# Patient Record
Sex: Male | Born: 1995 | Race: White | Hispanic: No | Marital: Single | Smoking: Former smoker
Health system: Southern US, Community
[De-identification: ages and names within clinical notes are randomized; demographics above are authoritative.]

## PROBLEM LIST (undated history)

## (undated) DIAGNOSIS — M5481 Occipital neuralgia: Secondary | ICD-10-CM

## (undated) DIAGNOSIS — H02401 Unspecified ptosis of right eyelid: Secondary | ICD-10-CM

## (undated) DIAGNOSIS — N289 Disorder of kidney and ureter, unspecified: Secondary | ICD-10-CM

## (undated) DIAGNOSIS — R059 Cough, unspecified: Secondary | ICD-10-CM

## (undated) DIAGNOSIS — R079 Chest pain, unspecified: Secondary | ICD-10-CM

## (undated) DIAGNOSIS — Z87442 Personal history of urinary calculi: Secondary | ICD-10-CM

## (undated) DIAGNOSIS — R05 Cough: Secondary | ICD-10-CM

## (undated) DIAGNOSIS — R809 Proteinuria, unspecified: Secondary | ICD-10-CM

## (undated) DIAGNOSIS — J309 Allergic rhinitis, unspecified: Secondary | ICD-10-CM

## (undated) DIAGNOSIS — S93409A Sprain of unspecified ligament of unspecified ankle, initial encounter: Secondary | ICD-10-CM

## (undated) DIAGNOSIS — R040 Epistaxis: Secondary | ICD-10-CM

## (undated) DIAGNOSIS — J039 Acute tonsillitis, unspecified: Secondary | ICD-10-CM

## (undated) HISTORY — PX: RENAL BIOPSY: SHX156

## (undated) HISTORY — DX: Cough, unspecified: R05.9

## (undated) HISTORY — DX: Cough: R05

## (undated) HISTORY — DX: Proteinuria, unspecified: R80.9

## (undated) HISTORY — DX: Chest pain, unspecified: R07.9

## (undated) HISTORY — DX: Occipital neuralgia: M54.81

## (undated) HISTORY — DX: Sprain of unspecified ligament of unspecified ankle, initial encounter: S93.409A

## (undated) HISTORY — DX: Allergic rhinitis, unspecified: J30.9

## (undated) HISTORY — DX: Disorder of kidney and ureter, unspecified: N28.9

## (undated) HISTORY — DX: Acute tonsillitis, unspecified: J03.90

## (undated) HISTORY — DX: Unspecified ptosis of right eyelid: H02.401

---
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
## 2014-03-30 ENCOUNTER — Emergency Department: Payer: Self-pay | Admitting: Emergency Medicine

## 2014-06-19 ENCOUNTER — Emergency Department: Payer: Self-pay | Admitting: Emergency Medicine

## 2014-08-06 ENCOUNTER — Emergency Department: Payer: Self-pay | Admitting: Emergency Medicine

## 2014-08-08 ENCOUNTER — Emergency Department: Payer: Self-pay | Admitting: Emergency Medicine

## 2014-08-10 LAB — WOUND CULTURE

## 2014-11-05 ENCOUNTER — Emergency Department: Payer: Self-pay | Admitting: Emergency Medicine

## 2014-12-16 ENCOUNTER — Emergency Department
Admission: EM | Admit: 2014-12-16 | Discharge: 2014-12-16 | Disposition: A | Discharge status: Home / Self Care | Payer: Medicaid Other | Attending: Emergency Medicine | Admitting: Emergency Medicine

## 2014-12-16 DIAGNOSIS — J069 Acute upper respiratory infection, unspecified: Secondary | ICD-10-CM

## 2014-12-16 DIAGNOSIS — R05 Cough: Secondary | ICD-10-CM | POA: Diagnosis present

## 2014-12-16 DIAGNOSIS — Z720 Tobacco use: Secondary | ICD-10-CM | POA: Insufficient documentation

## 2014-12-16 MED ORDER — PSEUDOEPH-BROMPHEN-DM 30-2-10 MG/5ML PO SYRP: 5.0000 mL | ORAL_SOLUTION | Freq: Four times a day (QID) | ORAL | Status: DC | PRN

## 2014-12-16 NOTE — Discharge Instructions (Signed)
Rest. Drink plenty of fluids. Follow up with your primary care physician or the above as needed.   Return to ER for new or worsening concerns.   Upper Respiratory Infection, Adult An upper respiratory infection (URI) is also sometimes known as the common cold. The upper respiratory tract includes the nose, sinuses, throat, trachea, and bronchi. Bronchi are the airways leading to the lungs. Most people improve within 1 week, but symptoms can last up to 2 weeks. A residual cough may last even longer.  CAUSES Many different viruses can infect the tissues lining the upper respiratory tract. The tissues become irritated and inflamed and often become very moist. Mucus production is also common. A cold is contagious. You can easily spread the virus to others by oral contact. This includes kissing, sharing a glass, coughing, or sneezing. Touching your mouth or nose and then touching a surface, which is then touched by another person, can also spread the virus. SYMPTOMS  Symptoms typically develop 1 to 3 days after you come in contact with a cold virus. Symptoms vary from person to person. They may include:  Runny nose.  Sneezing.  Nasal congestion.  Sinus irritation.  Sore throat.  Loss of voice (laryngitis).  Cough.  Fatigue.  Muscle aches.  Loss of appetite.  Headache.  Low-grade fever. DIAGNOSIS  You might diagnose your own cold based on familiar symptoms, since most people get a cold 2 to 3 times a year. Your caregiver can confirm this based on your exam. Most importantly, your caregiver can check that your symptoms are not due to another disease such as strep throat, sinusitis, pneumonia, asthma, or epiglottitis. Blood tests, throat tests, and X-rays are not necessary to diagnose a common cold, but they may sometimes be helpful in excluding other more serious diseases. Your caregiver will decide if any further tests are required. RISKS AND COMPLICATIONS  You may be at risk for a  more severe case of the common cold if you smoke cigarettes, have chronic heart disease (such as heart failure) or lung disease (such as asthma), or if you have a weakened immune system. The very young and very old are also at risk for more serious infections. Bacterial sinusitis, middle ear infections, and bacterial pneumonia can complicate the common cold. The common cold can worsen asthma and chronic obstructive pulmonary disease (COPD). Sometimes, these complications can require emergency medical care and may be life-threatening. PREVENTION  The best way to protect against getting a cold is to practice good hygiene. Avoid oral or hand contact with people with cold symptoms. Wash your hands often if contact occurs. There is no clear evidence that vitamin C, vitamin E, echinacea, or exercise reduces the chance of developing a cold. However, it is always recommended to get plenty of rest and practice good nutrition. TREATMENT  Treatment is directed at relieving symptoms. There is no cure. Antibiotics are not effective, because the infection is caused by a virus, not by bacteria. Treatment may include:  Increased fluid intake. Sports drinks offer valuable electrolytes, sugars, and fluids.  Breathing heated mist or steam (vaporizer or shower).  Eating chicken soup or other clear broths, and maintaining good nutrition.  Getting plenty of rest.  Using gargles or lozenges for comfort.  Controlling fevers with ibuprofen or acetaminophen as directed by your caregiver.  Increasing usage of your inhaler if you have asthma. Zinc gel and zinc lozenges, taken in the first 24 hours of the common cold, can shorten the duration and lessen the  severity of symptoms. Pain medicines may help with fever, muscle aches, and throat pain. A variety of non-prescription medicines are available to treat congestion and runny nose. Your caregiver can make recommendations and may suggest nasal or lung inhalers for other  symptoms.  HOME CARE INSTRUCTIONS   Only take over-the-counter or prescription medicines for pain, discomfort, or fever as directed by your caregiver.  Use a warm mist humidifier or inhale steam from a shower to increase air moisture. This may keep secretions moist and make it easier to breathe.  Drink enough water and fluids to keep your urine clear or pale yellow.  Rest as needed.  Return to work when your temperature has returned to normal or as your caregiver advises. You may need to stay home longer to avoid infecting others. You can also use a face mask and careful hand washing to prevent spread of the virus. SEEK MEDICAL CARE IF:   After the first few days, you feel you are getting worse rather than better.  You need your caregiver's advice about medicines to control symptoms.  You develop chills, worsening shortness of breath, or brown or red sputum. These may be signs of pneumonia.  You develop yellow or brown nasal discharge or pain in the face, especially when you bend forward. These may be signs of sinusitis.  You develop a fever, swollen neck glands, pain with swallowing, or white areas in the back of your throat. These may be signs of strep throat. SEEK IMMEDIATE MEDICAL CARE IF:   You have a fever.  You develop severe or persistent headache, ear pain, sinus pain, or chest pain.  You develop wheezing, a prolonged cough, cough up blood, or have a change in your usual mucus (if you have chronic lung disease).  You develop sore muscles or a stiff neck. Document Released: 01/24/2001 Document Revised: 10/23/2011 Document Reviewed: 11/05/2013 [facility] Patient Information 2015 [place], [place]. This information is not intended to replace advice given to you by your health care provider. Make sure you discuss any questions you have with your health care provider.

## 2014-12-16 NOTE — ED Notes (Signed)
Cough and congestion

## 2014-12-16 NOTE — ED Provider Notes (Signed)
[facility] Emergency Department Provider Note ____________________________________________  Time seen: ----------------------------------------- 8:08 PM on 12/16/2014 -----------------------------------------   I have reviewed the triage vital signs and the nursing notes.   HISTORY  Chief Complaint Cough    HPI Louis Miller is a 19 y.o. male presents to the ER today with 2 day history of runny nose cough and congestion. States multiple other people in same household with similar.Reports continues to eat and drink well. States congestion is biggest complaint. Denies wheezes, chest pain, shortness of breath sore throat, abdominal pain, nausea, vomiting or diarrhea.   Past Medical History  Diagnosis Date  . Renal disorder     IgA nephropathy  . Sprain of ankle, unspecified site   . Allergic rhinitis, cause unspecified   . Chest pain, unspecified   . Tonsillitis   . Proteinuria   . Cough   . Occipital neuralgia 06/11/2013    left  . 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 Occipital neuralgia 06/11/2013  . 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 Renal biopsy    States "kidney problems" as a child. States normal as an adult.  Medications: DENIES.  Allergies Shellfish allergy  Family History  Problem Relation Age of Onset  . Migraines Mother   . Hypertension Mother   . Hypertension Father     Social History History  Substance Use Topics  . Smoking status: Current Every Day Smoker    Types: Cigarettes  . Smokeless tobacco: Never Used  . Alcohol Use: No    Review of Systems  Constitutional: Negative for fever. Eyes: Negative for visual changes. ENT: as above. Cardiovascular: Negative for chest pain. Respiratory: Negative for shortness of breath. Gastrointestinal: Negative for abdominal pain, vomiting and  diarrhea. Genitourinary: Negative for dysuria. Musculoskeletal: Negative for back pain. Skin: Negative for rash. Neurological: Negative for headaches, focal weakness or numbness.   10-point ROS otherwise negative.  ____________________________________________   PHYSICAL EXAM: Blood pressure 129/75, pulse 66,resp 18 temperature 98 F (36.7 C), temperature source Oral, SpO2 97 %.  VITAL SIGNS: ED Triage Vitals  Enc Vitals Group     BP --      Pulse --      Resp --      Temp --      Temp src --      SpO2 --      Weight --      Height --      Head Cir --      Peak Flow --      Pain Score --      Pain Loc --      Pain Edu? --      Excl. in GC? --     Constitutional: Alert and oriented. Well appearing and in no distress. Eyes: Conjunctivae are normal. PERRL. Normal extraocular movements. ENT   Head: Normocephalic and atraumatic.   Nose: mild rhinorrhea, mild nasal mucosal inflammation   Mouth/Throat: Mucous membranes are moist. No pharyngeal erythema, no swelling or exudate.   Neck: No stridor. Hematological/Lymphatic/Immunilogical: No cervical lymphadenopathy. Cardiovascular: Normal rate, regular rhythm. Normal and symmetric distal pulses are present in all extremities. No murmurs, rubs, or gallops. Respiratory: Normal respiratory effort without tachypnea nor retractions. Breath sounds are clear and equal bilaterally. No wheezes/rales/rhonchi. Gastrointestinal: Soft and nontender. No distention. No abdominal bruits. There is no CVA tenderness. Genitourinary:  deferred Musculoskeletal: Nontender with normal range of motion in all extremities. No joint effusions.  No lower extremity tenderness nor edema. Neurologic:  Normal speech and language. No gross focal neurologic deficits are appreciated. Speech is normal. No gait instability. Skin:  Skin is warm, dry and intact. No rash noted. Psychiatric: Mood and affect are normal. Speech and behavior are normal.  Patient exhibits appropriate insight and judgment.  __   INITIAL IMPRESSION / ASSESSMENT AND PLAN / ED COURSE  Pertinent labs & imaging results that were available during my care of the patient were reviewed by me and considered in my medical decision making (see chart for details).  Very well appearing. Present with 2 days of URI signs and symptoms. Lungs clear throughout with good air movement. Rest. Follow up with primary care physician.  Discussed follow up and return precautions.  ____________________________________________   FINAL CLINICAL IMPRESSION(S) / ED DIAGNOSES  Final diagnoses:  Upper respiratory infection    Renford DillsLindsey Ozie Lupe, NP 12/16/14 [id number]  Phineas SemenGraydon Goodman, MD 12/16/14 [phone]

## 2015-09-24 ENCOUNTER — Emergency Department
Admission: EM | Admit: 2015-09-24 | Discharge: 2015-09-24 | Disposition: A | Discharge status: Home / Self Care | Payer: Medicaid Other | Attending: Emergency Medicine | Admitting: Emergency Medicine

## 2015-09-24 ENCOUNTER — Encounter: Payer: Self-pay | Admitting: Emergency Medicine

## 2015-09-24 DIAGNOSIS — N028 Recurrent and persistent hematuria with other morphologic changes: Secondary | ICD-10-CM | POA: Diagnosis not present

## 2015-09-24 DIAGNOSIS — R109 Unspecified abdominal pain: Secondary | ICD-10-CM

## 2015-09-24 DIAGNOSIS — F17210 Nicotine dependence, cigarettes, uncomplicated: Secondary | ICD-10-CM | POA: Insufficient documentation

## 2015-09-24 DIAGNOSIS — K047 Periapical abscess without sinus: Secondary | ICD-10-CM | POA: Insufficient documentation

## 2015-09-24 DIAGNOSIS — K0889 Other specified disorders of teeth and supporting structures: Secondary | ICD-10-CM | POA: Diagnosis present

## 2015-09-24 DIAGNOSIS — K032 Erosion of teeth: Secondary | ICD-10-CM | POA: Insufficient documentation

## 2015-09-24 LAB — URINALYSIS COMPLETE WITH MICROSCOPIC (ARMC ONLY)
BILIRUBIN URINE: NEGATIVE
Bacteria, UA: NONE SEEN
Glucose, UA: NEGATIVE mg/dL
KETONES UR: NEGATIVE mg/dL
Leukocytes, UA: NEGATIVE
Nitrite: NEGATIVE
PROTEIN: NEGATIVE mg/dL
Specific Gravity, Urine: 1.012 (ref 1.005–1.030)
WBC, UA: NONE SEEN WBC/hpf (ref 0–5)
pH: 7 (ref 5.0–8.0)

## 2015-09-24 MED ORDER — NAPROXEN 500 MG PO TABS: 500.0000 mg | ORAL_TABLET | Freq: Two times a day (BID) | ORAL | Status: DC

## 2015-09-24 MED ORDER — AMOXICILLIN 875 MG PO TABS: 875.0000 mg | ORAL_TABLET | Freq: Two times a day (BID) | ORAL | Status: DC

## 2015-09-24 MED ORDER — LIDOCAINE-EPINEPHRINE 2 %-1:100000 IJ SOLN
INTRAMUSCULAR | Status: AC
  Administered 2015-09-24: 1.7 mL
  Filled 2015-09-24: qty 1.7

## 2015-09-24 MED ORDER — LIDOCAINE-EPINEPHRINE 2 %-1:100000 IJ SOLN: 1.7000 mL | Freq: Once | INTRAMUSCULAR | Status: AC

## 2015-09-24 MED ORDER — MAGIC MOUTHWASH W/LIDOCAINE: 5.0000 mL | Freq: Four times a day (QID) | ORAL | Status: DC

## 2015-09-24 NOTE — ED Notes (Signed)
Reports toothache.  Also reports known cyst on kidney, recent increased flank pain.  Has apt with kidney doctor next month

## 2015-09-24 NOTE — Discharge Instructions (Signed)
Dental Pain Dental pain may be caused by many things, including:  Tooth decay (cavities or caries). Cavities expose the nerve of your tooth to air and hot or cold temperatures. This can cause pain or discomfort.  Abscess or infection. A dental abscess is a collection of infected pus from a bacterial infection in the inner part of the tooth (pulp). It usually occurs at the end of the tooth's root.  Injury.  An unknown reason (idiopathic). Your pain may be mild or severe. It may only occur when:  You are chewing.  You are exposed to hot or cold temperature.  You are eating or drinking sugary foods or beverages, such as soda or candy. Your pain may also be constant. HOME CARE INSTRUCTIONS Watch your dental pain for any changes. The following actions may help to lessen any discomfort that you are feeling:  Take medicines only as directed by your dentist.  If you were prescribed an antibiotic medicine, finish all of it even if you start to feel better.  Keep all follow-up visits as directed by your dentist. This is important.  Do not apply heat to the outside of your face.  Rinse your mouth or gargle with salt water if directed by your dentist. This helps with pain and swelling.  You can make salt water by adding  tsp of salt to 1 cup of warm water.  Apply ice to the painful area of your face:  Put ice in a plastic bag.  Place a towel between your skin and the bag.  Leave the ice on for 20 minutes, 2-3 times per day.  Avoid foods or drinks that cause you pain, such as:  Very hot or very cold foods or drinks.  Sweet or sugary foods or drinks. SEEK MEDICAL CARE IF:  Your pain is not controlled with medicines.  Your symptoms are worse.  You have new symptoms. SEEK IMMEDIATE MEDICAL CARE IF:  You are unable to open your mouth.  You are having trouble breathing or swallowing.  You have a fever.  Your face, neck, or jaw is swollen.   This information is not  intended to replace advice given to you by your health care provider. Make sure you discuss any questions you have with your health care provider.   Document Released: 07/31/2005 Document Revised: 12/15/2014 Document Reviewed: 07/27/2014 Elsevier Interactive Patient Education 2016 [employer]  Flank Pain Flank pain refers to pain that is located on the side of the body between the upper abdomen and the back. The pain may occur over a short period of time (acute) or may be long-term or reoccurring (chronic). It may be mild or severe. Flank pain can be caused by many things. CAUSES  Some of the more common causes of flank pain include:  Muscle strains.   Muscle spasms.   A disease of your spine (vertebral disk disease).   A lung infection (pneumonia).   Fluid around your lungs (pulmonary edema).   A kidney infection.   Kidney stones.   A very painful skin rash caused by the chickenpox virus (shingles).   Gallbladder disease.  HOME CARE INSTRUCTIONS  Home care will depend on the cause of your pain. In general,  Rest as directed by your caregiver.  Drink enough fluids to keep your urine clear or pale yellow.  Only take over-the-counter or prescription medicines as directed by your caregiver. Some medicines may help relieve the pain.  Tell your caregiver about any changes in your pain.  Follow up with your caregiver as directed. SEEK IMMEDIATE MEDICAL CARE IF:   Your pain is not controlled with medicine.   You have new or worsening symptoms.  Your pain increases.   You have abdominal pain.   You have shortness of breath.   You have persistent nausea or vomiting.   You have swelling in your abdomen.   You feel faint or pass out.   You have blood in your urine.  You have a fever or persistent symptoms for more than 2-3 days.  You have a fever and your symptoms suddenly get worse. MAKE SURE YOU:   Understand these instructions.  Will watch  your condition.  Will get help right away if you are not doing well or get worse.   This information is not intended to replace advice given to you by your health care provider. Make sure you discuss any questions you have with your health care provider.   Document Released: 09/21/2005 Document Revised: 04/24/2012 Document Reviewed: 03/14/2012 Elsevier Interactive Patient Education [employer]

## 2015-09-24 NOTE — ED Provider Notes (Signed)
[facility] Emergency Department Provider Note  ____________________________________________  Time seen: Approximately 4:04 PM  I have reviewed the triage vital signs and the nursing notes.   HISTORY  Chief Complaint Back Pain and Dental Pain    HPI Louis Miller is a 20 y.o. male who presents emergency department complaining of left upper dentition pain as well as right flank pain. Patient states that his dental pain has been ongoing for a week to 10 days. Patient states that he has known poor dentition to the left upper dentition with dental erosion into the pulp. Patient has scheduled a dentist appointment but states that the first available is in 2 weeks. Patient denies any fevers or chills, difficulty breathing or swallowing, sore throat or nausea or vomiting.  Patient is also endorsing right flank pain. Patient has a known history of IgA nephropathy that he has been followed by his nephrologist for. He states that he has an appointment with a nephrologist next month but is concerned that he has a progression of his illness. Patient states that he also has a cyst on one of his kidneys but he is unsure of laterality. The patient denies any hematuria but does endorse dysuria and polyuria. Symptoms have been ongoing for 24 hours. Patient was on chronic medication for this condition but states that he was stopped by his nephrologist a year prior. He states the progression has been slow to this point.   Past Medical History  Diagnosis Date  . Renal disorder     IgA nephropathy  . Sprain of ankle, unspecified site   . Allergic rhinitis, cause unspecified   . Chest pain, unspecified   . Tonsillitis   . Proteinuria   . Cough   . Occipital neuralgia 06/11/2013    left  . 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 Occipital neuralgia 06/11/2013  . 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 Renal biopsy      Current Outpatient Rx  Name  Route  Sig  Dispense  Refill  . amoxicillin (AMOXIL) 875 MG tablet   Oral   Take 1 tablet (875 mg total) by mouth 2 (two) times daily.   14 tablet   0   . brompheniramine-pseudoephedrine-DM 30-2-10 MG/5ML syrup   Oral   Take 5 mLs by mouth 4 (four) times daily as needed (as needed for cough or congestion).   100 mL   0   . magic mouthwash w/lidocaine SOLN   Oral   Take 5 mLs by mouth 4 (four) times daily.   240 mL   0     Dispense in a 1/1/1/1 ratio. Use lidocaine, diphen ...   . naproxen (NAPROSYN) 500 MG tablet   Oral   Take 1 tablet (500 mg total) by mouth 2 (two) times daily with a meal.   60 tablet   0     Allergies Shellfish allergy  Family History  Problem Relation Age of Onset  . Migraines Mother   . Hypertension Mother   . Hypertension Father     Social History Social History  Substance Use Topics  . Smoking status: Current Every Day Smoker    Types: Cigarettes  . Smokeless tobacco: Never Used  . Alcohol Use: No     Review of Systems  Constitutional: No fever/chills ENT: No sore throat. Positive for left upper dental  pain. Cardiovascular: no chest pain. Respiratory: no cough. No SOB. Gastrointestinal: No abdominal pain.  No nausea, no vomiting.  No diarrhea.  No constipation. Genitourinary: Positive for dysuria and polyuria.. No hematuria. positive for right flank pain. Musculoskeletal: Negative for back pain. Skin: Negative for rash. Neurological: Negative for headaches, focal weakness or numbness. 10-point ROS otherwise negative.  ____________________________________________   PHYSICAL EXAM:  VITAL SIGNS: ED Triage Vitals  Enc Vitals Group     BP 09/24/15 1558 138/80 mmHg     Pulse Rate 09/24/15 1558 79     Resp 09/24/15 1558 18     Temp 09/24/15 1558 98.4 F (36.9 C)     Temp Source 09/24/15 1558 Oral     SpO2 09/24/15 1558 100 %     Weight  09/24/15 1558 180 lb (81.647 kg)     Height 09/24/15 1558  (1.803 m)     Head Cir --      Peak Flow --      Pain Score 09/24/15 1559 7     Pain Loc --      Pain Edu? --      Excl. in GC? --      Constitutional: Alert and oriented. Well appearing and in no acute distress. Eyes: Conjunctivae are normal. PERRL. EOMI. Head: Atraumatic. ENT:      Mouth/Throat: Mucous membranes are moist. Dental erosion into the pulp to the #14 tooth. Surrounding erythema and edema. No purulent drainage. No fluctuance to palpation with tongue depressor. Neck: No stridor.   Hematological/Lymphatic/Immunilogical: No cervical lymphadenopathy. Cardiovascular: Normal rate, regular rhythm. Normal S1 and S2.  Good peripheral circulation. Respiratory: Normal respiratory effort without tachypnea or retractions. Lungs CTAB. Gastrointestinal: Bowel sounds 4 quadrants. Soft and nontender. No guarding or rigidity. No distention. No CVA tenderness. Neurologic:  Normal speech and language. No gross focal neurologic deficits are appreciated.  Skin:  Skin is warm, dry and intact. No rash noted. Psychiatric: Mood and affect are normal. Speech and behavior are normal. Patient exhibits appropriate insight and judgement.   ____________________________________________   LABS (all labs ordered are listed, but only abnormal results are displayed)  Labs Reviewed  URINALYSIS COMPLETEWITH MICROSCOPIC ([facility] ONLY) - Abnormal; Notable for the following:    Color, Urine STRAW (*)    APPearance CLEAR (*)    Hgb urine dipstick 1+ (*)    Squamous Epithelial / LPF 0-5 (*)    All other components within normal limits   ____________________________________________  EKG   ____________________________________________  RADIOLOGY   No results found.  ____________________________________________    PROCEDURES  Procedure(s) performed:   Dental block is performed using 1.7 mL of lidocaine with epi. This was  administered using a 27-gauge needle on the dental Carpijet This is injected into the nerve root over the upper dentition. Patient tolerated procedure well and had good analgesic control.    Medications  lidocaine-EPINEPHrine (XYLOCAINE W/EPI) 2 %-1:100000 (with pres) injection 1.7 mL (1.7 mLs Infiltration Given by Other 09/24/15 1630)     ____________________________________________   INITIAL IMPRESSION / ASSESSMENT AND PLAN / ED COURSE  Pertinent labs & imaging results that were available during my care of the patient were reviewed by me and considered in my medical decision making (see chart for details).   ----------------------------------------- 4:24 PM on 09/24/2015 -----------------------------------------  Patient presents with dental pain and right flank pain. Dental pain is consistent with dental erosion into the pulp. There is surrounding erythema and edema consistent with spreading cellulitis/infection. Patient will  be given a digital block care of emergency Department and discharged home with antibiotics for dental pain.  Patient has a known history of IgA nephropathy as well as cyst to unknown kidney. Patient is having increased flank pain but also endorses polyuria and dysuria. At this time evaluation will start with urinalysis. If this returned unremarkable further workup will be considered. This returned for urinary tract infection and will be treated for same and no further workup will be undertaken. If urinalysis reveals significant proteinuria further workup will be undertaken. Patient is nontender to palpation of the abdomen and suprapubic region. There is no CVA tenderness at this time. Exam is reassuring but we will wait for the laboratory results.     ----------------------------------------- 4:36 PM on 09/24/2015 -----------------------------------------  Urinalysis returns with reassuring results. At this time patient is in no acute distress, minimal pain,  with reassuring urinalysis. I discussed at length with patient options to include further workup, or treatment with anti-inflammatories and Tylenol with follow-up with his nephrologist. Patient opts at this time to go with medication and follow-up with his nephrologist. I support this decision and agree with same. Patient will be discharged with anti-inflammatories instructions to follow-up with his nephrologist in 1 month.    Patient's diagnosis is consistent with dental erosion surrounding infection as well as flank pain. Patient will be discharged home with prescriptions for Magic mouthwash, antibiotics, and anti-inflammatories. Patient is to follow up with dentist as well as nephrologist if symptoms persist past this treatment course. Patient is given ED precautions to return to the ED for any worsening or new symptoms.     ____________________________________________  FINAL CLINICAL IMPRESSION(S) / ED DIAGNOSES  Final diagnoses:  Dental erosion extending into pulp  Dental infection  IgA nephropathy  Flank pain      NEW MEDICATIONS STARTED DURING THIS VISIT:  New Prescriptions   AMOXICILLIN (AMOXIL) 875 MG TABLET    Take 1 tablet (875 mg total) by mouth 2 (two) times daily.   MAGIC MOUTHWASH W/LIDOCAINE SOLN    Take 5 mLs by mouth 4 (four) times daily.   NAPROXEN (NAPROSYN) 500 MG TABLET    Take 1 tablet (500 mg total) by mouth 2 (two) times daily with a meal.        Racheal Patches, PA-C 09/24/15 1641  Governor Rooks, MD 09/24/15 2124

## 2016-08-28 DIAGNOSIS — R509 Fever, unspecified: Secondary | ICD-10-CM | POA: Diagnosis not present

## 2016-08-28 DIAGNOSIS — R69 Illness, unspecified: Secondary | ICD-10-CM | POA: Diagnosis not present

## 2016-11-16 DIAGNOSIS — N281 Cyst of kidney, acquired: Secondary | ICD-10-CM | POA: Diagnosis not present

## 2016-11-16 DIAGNOSIS — Z6827 Body mass index (BMI) 27.0-27.9, adult: Secondary | ICD-10-CM | POA: Diagnosis not present

## 2016-11-16 DIAGNOSIS — N028 Recurrent and persistent hematuria with other morphologic changes: Secondary | ICD-10-CM | POA: Diagnosis not present

## 2016-12-11 DIAGNOSIS — H20043 Secondary noninfectious iridocyclitis, bilateral: Secondary | ICD-10-CM | POA: Diagnosis not present

## 2016-12-20 DIAGNOSIS — N028 Recurrent and persistent hematuria with other morphologic changes: Secondary | ICD-10-CM | POA: Diagnosis not present

## 2017-02-02 LAB — LIPID PANEL
Cholesterol: 175 (ref 0–200)
HDL: 48 (ref 35–70)
LDL Cholesterol: 115
Triglycerides: 60 (ref 40–160)

## 2017-02-02 LAB — TSH: TSH: 1.73 (ref 0.41–5.90)

## 2017-05-10 DIAGNOSIS — H52221 Regular astigmatism, right eye: Secondary | ICD-10-CM | POA: Diagnosis not present

## 2017-05-10 DIAGNOSIS — H5213 Myopia, bilateral: Secondary | ICD-10-CM | POA: Diagnosis not present

## 2018-01-10 ENCOUNTER — Emergency Department: Payer: BLUE CROSS/BLUE SHIELD

## 2018-01-10 ENCOUNTER — Other Ambulatory Visit: Payer: Self-pay

## 2018-01-10 ENCOUNTER — Emergency Department
Admission: EM | Admit: 2018-01-10 | Discharge: 2018-01-10 | Disposition: A | Discharge status: Home / Self Care | Payer: BLUE CROSS/BLUE SHIELD | Attending: Student in an Organized Health Care Education/Training Program | Admitting: Student in an Organized Health Care Education/Training Program

## 2018-01-10 ENCOUNTER — Encounter: Payer: Self-pay | Admitting: Emergency Medicine

## 2018-01-10 DIAGNOSIS — R079 Chest pain, unspecified: Secondary | ICD-10-CM

## 2018-01-10 DIAGNOSIS — Z87891 Personal history of nicotine dependence: Secondary | ICD-10-CM | POA: Insufficient documentation

## 2018-01-10 DIAGNOSIS — Z79899 Other long term (current) drug therapy: Secondary | ICD-10-CM | POA: Insufficient documentation

## 2018-01-10 LAB — CBC
HCT: 44.4 % (ref 40.0–52.0)
Hemoglobin: 15.2 g/dL (ref 13.0–18.0)
MCH: 29 pg (ref 26.0–34.0)
MCHC: 34.3 g/dL (ref 32.0–36.0)
MCV: 84.5 fL (ref 80.0–100.0)
PLATELETS: 262 10*3/uL (ref 150–440)
RBC: 5.26 MIL/uL (ref 4.40–5.90)
RDW: 12.7 % (ref 11.5–14.5)
WBC: 8.2 10*3/uL (ref 3.8–10.6)

## 2018-01-10 LAB — BASIC METABOLIC PANEL
Anion gap: 10 (ref 5–15)
BUN: 10 mg/dL (ref 6–20)
CALCIUM: 9.4 mg/dL (ref 8.9–10.3)
CO2: 26 mmol/L (ref 22–32)
Chloride: 99 mmol/L — ABNORMAL LOW (ref 101–111)
Creatinine, Ser: 1.08 mg/dL (ref 0.61–1.24)
GFR calc Af Amer: >60 mL/min (ref >60)
GLUCOSE: 74 mg/dL (ref 65–99)
Potassium: 3.4 mmol/L — ABNORMAL LOW (ref 3.5–5.1)
SODIUM: 135 mmol/L (ref 135–145)

## 2018-01-10 LAB — TROPONIN I

## 2018-01-10 NOTE — ED Triage Notes (Signed)
Squeezing chest pian left chest started about 1030am.  Did have nausea at first but not now.  Says he lay down in the truck for 30 min and it went away.

## 2018-01-10 NOTE — ED Notes (Signed)
Pt discharged home after verbalizing understanding of discharge instructions; nad noted. 

## 2018-01-10 NOTE — ED Provider Notes (Signed)
[facility] Emergency Department Provider Note    First MD Initiated Contact with Patient 01/10/18 1540     (approximate)  I have reviewed the triage vital signs and the nursing notes.   HISTORY  Chief Complaint Chest Pain    HPI Louis Miller is a 22 y.o. male with a history of allergic rhinitis as well as IgA nephropathy presents to the ER with chest pain that occurred around 10:30 AM.  States he had nausea at first but it resolved.  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  States he was having some shortness of breath but that since resolved.  Denies any fevers.  States he was also having tingling and pressure in his arm.  He does vape but does not smoke.  Denies any history of anxiety or depression.  Denies any symptoms at this time.  Past Medical History:  Diagnosis Date  . Allergic rhinitis, cause unspecified   . Chest pain, unspecified   . Cough   . Occipital neuralgia 06/11/2013   left  . Proteinuria   . 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 Renal disorder    IgA nephropathy  . Sprain of ankle, unspecified site   . Tonsillitis    Family History  Problem Relation Age of Onset  . Migraines Mother   . Hypertension Mother   . Hypertension Father    Past Surgical History:  Procedure Laterality Date  . RENAL BIOPSY     Patient Active Problem List   Diagnosis Date Noted  . Occipital neuralgia 06/11/2013  . 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 Authorizing Provider  amoxicillin (AMOXIL) 875 MG tablet Take 1 tablet (875 mg total) by mouth 2 (two) times daily. 09/24/15   Cuthriell, Delorise Royals, PA-C  brompheniramine-pseudoephedrine-DM 30-2-10 MG/5ML syrup Take 5 mLs by mouth 4 (four) times daily as needed (as needed for cough or congestion). 12/16/14   Renford Dills, NP  magic mouthwash w/lidocaine SOLN Take 5 mLs by mouth 4 (four) times daily. 09/24/15   Cuthriell, Delorise Royals, PA-C  naproxen (NAPROSYN) 500 MG tablet Take 1 tablet (500 mg total) by mouth 2 (two) times daily with a meal. 09/24/15   Cuthriell, Delorise Royals, PA-C  albuterol (PROVENTIL HFA;VENTOLIN HFA) 108 (90 BASE) MCG/ACT inhaler Inhale 2 puffs into the lungs every 6 (six) hours as needed for wheezing.  12/16/14  [provider]  lisinopril (PRINIVIL,ZESTRIL) 5 MG tablet Take 5 mg by mouth daily.  12/16/14  [provider]    Allergies Shellfish allergy    Social History Social History   Tobacco Use  . Smoking status: Former Smoker    Types: Cigarettes  . Smokeless tobacco: Never Used  Substance Use Topics  . Alcohol use: Yes    Comment: occasional  . Drug use: No    Review of Systems Patient denies headaches, rhinorrhea, blurry vision, numbness, shortness of breath, chest pain, edema, cough, abdominal pain, nausea, vomiting, diarrhea, dysuria, fevers, rashes or hallucinations unless otherwise stated above in HPI. ____________________________________________   PHYSICAL EXAM:  VITAL SIGNS: Vitals:   01/10/18 1345  BP: (!) 143/79  Pulse: 83  Resp: 16  Temp: 99 F (37.2 C)  SpO2: 98%    Constitutional: Alert and oriented. Well appearing  and in no acute distress. Eyes: Conjunctivae are normal.  Head: Atraumatic. Nose: No congestion/rhinnorhea. Mouth/Throat: Mucous membranes are moist.   Neck: No stridor. Painless ROM.  Cardiovascular: Normal rate, regular rhythm. Grossly normal heart sounds.  Good peripheral circulation. Respiratory: Normal respiratory effort.  No retractions. Lungs CTAB. Gastrointestinal: Soft and nontender. No distention. No abdominal bruits. No CVA tenderness. Genitourinary:  Musculoskeletal: No lower extremity tenderness nor edema.  No joint effusions. Neurologic:  Normal speech and language. No gross focal neurologic  deficits are appreciated. No facial droop Skin:  Skin is warm, dry and intact. No rash noted. Psychiatric: Mood and affect are normal. Speech and behavior are normal.  ____________________________________________   LABS (all labs ordered are listed, but only abnormal results are displayed)  Results for orders placed or performed during the hospital encounter of 01/10/18 (from the past 24 hour(s))  Basic metabolic panel     Status: Abnormal   Collection Time: 01/10/18  1:47 PM  Result Value Ref Range   Sodium 135 135 - 145 mmol/L   Potassium 3.4 (L) 3.5 - 5.1 mmol/L   Chloride 99 (L) 101 - 111 mmol/L   CO2 26 22 - 32 mmol/L   Glucose, Bld 74 65 - 99 mg/dL   BUN 10 6 - 20 mg/dL   Creatinine, Ser 1.61 0.61 - 1.24 mg/dL   Calcium 9.4 8.9 - 09.6 mg/dL   GFR calc non Af Amer >60 >60 mL/min   GFR calc Af Amer >60 >60 mL/min   Anion gap 10 5 - 15  CBC     Status: None   Collection Time: 01/10/18  1:47 PM  Result Value Ref Range   WBC 8.2 3.8 - 10.6 K/uL   RBC 5.26 4.40 - 5.90 MIL/uL   Hemoglobin 15.2 13.0 - 18.0 g/dL   HCT 04.5 40.9 - 81.1 %   MCV 84.5 80.0 - 100.0 fL   MCH 29.0 26.0 - 34.0 pg   MCHC 34.3 32.0 - 36.0 g/dL   RDW 91.4 78.2 - 95.6 %   Platelets 262 150 - 440 K/uL  Troponin I     Status: None   Collection Time: 01/10/18  1:47 PM  Result Value Ref Range   Troponin I <0.03 <0.03 ng/mL   ____________________________________________  EKG My review and personal interpretation at Time: 13:43   Indication: chest pain  Rate: 80  Rhythm: sinus Axis: normal Other: normal intervals, no stemi ____________________________________________  RADIOLOGY  I personally reviewed all radiographic images ordered to evaluate for the above acute complaints and reviewed radiology reports and findings.  These findings were personally discussed with the patient.  Please see medical record for radiology report.  ____________________________________________   PROCEDURES  Procedure(s)  performed:  Procedures    Critical Care performed: no ____________________________________________   INITIAL IMPRESSION / ASSESSMENT AND PLAN / ED COURSE  Pertinent labs & imaging results that were available during my care of the patient were reviewed by me and considered in my medical decision making (see chart for details).  DDX: ACS, pericarditis, esophagitis, boerhaaves, pe, dissection, pna, bronchitis, costochondritis   Levie Schaum is a 22 y.o. who presents to the ED with chest pain as described above.  Currently is afebrile and asymptomatic.  No hypoxia.  Patient low risk by Wells criteria and is PERC negative.  No evidence of pneumothorax.  Not clinically consistent with dissection.  Patient with heart score of 1.  Possible bronchospasm as he was spraying herbicide but no evidence of  diminished tinnitus or wheezing on exam at this time.  Possible esophageal spasm.  Abdominal exam is soft and benign.  At this point do believe patient stable and appropriate outpatient follow-up.      As part of my medical decision making, I reviewed the following data within the electronic MEDICAL RECORD NUMBER Nursing notes reviewed and incorporated, Labs reviewed, notes from prior ED visits.  ____________________________________________   FINAL CLINICAL IMPRESSION(S) / ED DIAGNOSES  Final diagnoses:  Nonspecific chest pain      NEW MEDICATIONS STARTED DURING THIS VISIT:  New Prescriptions   No medications on file     Note:  This document was prepared using Dragon voice recognition software and may include unintentional dictation errors.    Willy Eddy, MD 01/10/18 [phone]

## 2018-01-25 DIAGNOSIS — R079 Chest pain, unspecified: Secondary | ICD-10-CM | POA: Diagnosis not present

## 2018-01-25 DIAGNOSIS — I341 Nonrheumatic mitral (valve) prolapse: Secondary | ICD-10-CM | POA: Diagnosis not present

## 2018-01-25 DIAGNOSIS — N028 Recurrent and persistent hematuria with other morphologic changes: Secondary | ICD-10-CM | POA: Diagnosis not present

## 2018-02-04 DIAGNOSIS — Z0000 Encounter for general adult medical examination without abnormal findings: Secondary | ICD-10-CM | POA: Diagnosis not present

## 2018-02-07 DIAGNOSIS — R079 Chest pain, unspecified: Secondary | ICD-10-CM | POA: Diagnosis not present

## 2018-02-07 DIAGNOSIS — N028 Recurrent and persistent hematuria with other morphologic changes: Secondary | ICD-10-CM | POA: Diagnosis not present

## 2018-02-07 DIAGNOSIS — I341 Nonrheumatic mitral (valve) prolapse: Secondary | ICD-10-CM | POA: Diagnosis not present

## 2018-02-12 DIAGNOSIS — N028 Recurrent and persistent hematuria with other morphologic changes: Secondary | ICD-10-CM | POA: Diagnosis not present

## 2018-02-12 DIAGNOSIS — R0789 Other chest pain: Secondary | ICD-10-CM | POA: Diagnosis not present

## 2018-02-15 DIAGNOSIS — S60221A Contusion of right hand, initial encounter: Secondary | ICD-10-CM | POA: Diagnosis not present

## 2018-02-21 DIAGNOSIS — S60221A Contusion of right hand, initial encounter: Secondary | ICD-10-CM | POA: Diagnosis not present

## 2018-02-21 DIAGNOSIS — S60031D Contusion of right middle finger without damage to nail, subsequent encounter: Secondary | ICD-10-CM | POA: Diagnosis not present

## 2018-03-21 DIAGNOSIS — S60221D Contusion of right hand, subsequent encounter: Secondary | ICD-10-CM | POA: Diagnosis not present

## 2018-03-24 DIAGNOSIS — R079 Chest pain, unspecified: Secondary | ICD-10-CM | POA: Insufficient documentation

## 2018-03-24 NOTE — Progress Notes (Deleted)
Cardiology Office Note  Date:  03/24/2018   ID:  Randel BooksGriffen Urbanek, DOB 1996/08/12, MRN [id number]  PCP:  Patient, No Pcp Per   No chief complaint on file.   HPI:  Randel BooksGriffen Impastato is a 22 y.o. male   allergic rhinitis   IgA nephropathy   presents to the ER 01/10/2018  with chest pain that occurred around 10:30 AM.  States he had nausea at first but it resolved.  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  States he was having some shortness of breath but that since resolved.  Denies any fevers.  States he was also having tingling and pressure in his arm.  He does vape but does not smoke.  Denies any history of anxiety or depression.  Denies any symptoms at this time.   Cardiac w/u negative Hospital records reviewed with the patient in detail   PMH:   has a past medical history of Allergic rhinitis, cause unspecified, Chest pain, unspecified, Cough, Occipital neuralgia (06/11/2013), Proteinuria, Ptosis of right eyelid (06/11/2013), Renal disorder, Sprain of ankle, unspecified site, and Tonsillitis.  PSH:    Past Surgical History:  Procedure Laterality Date  . RENAL BIOPSY      Current Outpatient Medications  Medication Sig Dispense Refill  . amoxicillin (AMOXIL) 875 MG tablet Take 1 tablet (875 mg total) by mouth 2 (two) times daily. 14 tablet 0  . brompheniramine-pseudoephedrine-DM 30-2-10 MG/5ML syrup Take 5 mLs by mouth 4 (four) times daily as needed (as needed for cough or congestion). 100 mL 0  . magic mouthwash w/lidocaine SOLN Take 5 mLs by mouth 4 (four) times daily. 240 mL 0  . naproxen (NAPROSYN) 500 MG tablet Take 1 tablet (500 mg total) by mouth 2 (two) times daily with a meal. 60 tablet 0   No current facility-administered medications for this visit.      Allergies:   Shellfish allergy   Social History:  The patient  reports that he has quit smoking. His smoking use  included cigarettes. He has never used smokeless tobacco. He reports that he drinks alcohol. He reports that he does not use drugs.   Family History:   family history includes Hypertension in his father and mother; Migraines in his mother.    Review of Systems: ROS   PHYSICAL EXAM: VS:  There were no vitals taken for this visit. , BMI There is no height or weight on file to calculate BMI. GEN: Well nourished, well developed, in no acute distress  HEENT: normal  Neck: no JVD, carotid bruits, or masses Cardiac: RRR; no murmurs, rubs, or gallops,no edema  Respiratory:  clear to auscultation bilaterally, normal work of breathing GI: soft, nontender, nondistended, + BS MS: no deformity or atrophy  Skin: warm and dry, no rash Neuro:  Strength and sensation are intact Psych: euthymic mood, full affect    Recent Labs: 01/10/2018: BUN 10; Creatinine, Ser 1.08; Hemoglobin 15.2; Platelets 262; Potassium 3.4; Sodium 135    Lipid Panel No results found for: CHOL, HDL, LDLCALC, TRIG    Wt Readings from Last 3 Encounters:  01/10/18 208 lb (94.3 kg)  09/24/15 180 lb (81.6 kg)  06/11/13 161 lb (73 kg) (66 %, Z= 0.40)*   * Growth percentiles are based on CDC (Boys, 2-20 Years) data.       ASSESSMENT AND PLAN:  No diagnosis found.  Disposition:   F/U  6 months  No orders of the defined types were placed in this encounter.    Signed, Dossie Arbour, M.D., Ph.D. 03/24/2018  [facility] [place], [place] [phone]

## 2018-03-25 ENCOUNTER — Ambulatory Visit: Payer: BLUE CROSS/BLUE SHIELD | Admitting: Cardiovascular Disease

## 2018-03-26 ENCOUNTER — Encounter: Payer: Self-pay | Admitting: Cardiovascular Disease

## 2018-08-02 DIAGNOSIS — J301 Allergic rhinitis due to pollen: Secondary | ICD-10-CM | POA: Diagnosis not present

## 2018-08-02 DIAGNOSIS — J342 Deviated nasal septum: Secondary | ICD-10-CM | POA: Diagnosis not present

## 2018-08-02 DIAGNOSIS — J3489 Other specified disorders of nose and nasal sinuses: Secondary | ICD-10-CM | POA: Diagnosis not present

## 2018-08-08 ENCOUNTER — Inpatient Hospital Stay: Admission: RE | Admit: 2018-08-08 | Payer: BLUE CROSS/BLUE SHIELD | Source: Ambulatory Visit

## 2018-08-09 ENCOUNTER — Encounter
Admission: RE | Admit: 2018-08-09 | Discharge: 2018-08-09 | Disposition: A | Discharge status: Home / Self Care | Payer: BLUE CROSS/BLUE SHIELD | Source: Ambulatory Visit | Attending: Otolaryngology | Admitting: Otolaryngology

## 2018-08-09 ENCOUNTER — Other Ambulatory Visit: Payer: Self-pay

## 2018-08-09 HISTORY — DX: Personal history of urinary calculi: Z87.442

## 2018-08-09 HISTORY — DX: Epistaxis: R04.0

## 2018-08-09 NOTE — Pre-Procedure Instructions (Signed)
Progress Notes - documented in this encounter Richarda Blade, MD - 11/16/2016 2:00 PM EDT Formatting of this note might be different from the original. 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 Louis Miller is a 22yo Caucasian man diagnosed with IgA on renal biopsy in 05/2008 who presents f/u nephrology care. His renal function and BP have been normal. He has been intolerant to ACE-I even low dose due to orthostatic symptoms.   He has been doing well over the past year and reports no ED visits, health changes. No tea or cola colored urine. No viral URIs but some sinus issues but no urine changes with these episodes. No rashes, abd pain. Using protein supplement to try to gain weight - unclear dose of protein. He has h/o renal cyst (we dont' have prior imaging available) and ordered renal US last year but he 'chickened out' and didn't get it done. He's like to now.   ROS: 10 system ROS negative except per HPI listed above  PAST MEDICAL HISTORY: Past Medical History:  Diagnosis Date  . IgA nephropathy   SOCIAL HISTORY: Rare tobacco, rare EtOH (max 2 drinks), no drugs. Lives with fiance and 2yo son. 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  MEDICATIONS: No current outpatient prescriptions on file.   No current facility-administered medications for this visit.   PHYSICAL EXAM: Vitals:  11/16/16 1359  BP: 127/70  Pulse: 70   Wt 88.2kg Gen - appears well ENT - MMM Neck - no adenopathy CV - RRR Lungs - clear Back - no flank TTP Extr - no edema Skin - no rashes or lesions  MEDICAL DECISION MAKING No chemistries available  UA today: 1.005, 6, neg LE and nit, trace protein, trace blood  Urine sediment today: bland - no RBC seen  UA 12/2013 : 1.015, 1+ protein, trace blood Urine Sediment 12/2013: rare RTE, <1 RBC/hpf only a few of which appear dysmorphic. No  cellular casts.   07/2013: WBC 6, Hb 15.2, Plt 283, Na 135, K 4.4, Cl 99, Bicarb 31, BUN 10, Cr 0.96, Phos 3.4, Ca 10.1, MACR 93  ASSESSMENT/PLAN:Mr.Louis Miller is a 22 y.o. year old patient with a past medical history significant for IgA nephropathy who is being seen for follow up  **IgA nephropathy, CKD G1A2: Biopsy proven in 2009, no high risk features to warrant immunosuppressive therapy. check BMP today. Hasn't tolerated ACE-I due to low BPs though today BP is higher; if proteinuria up will recommend trying low dose ACE-I again. Rec Avoiding NSAIDs. He's going to let us know how much protein is in his supplement. He will also call if urine character changes.   **Proteinuria: Secondary to IgA, check labs today but on dipstick trace, so probably hasn't increased.   **H/o renal cyst: no prior imaging available - in context of L flank pain and h/o cyst obtain renal US.   Mr.Louis Miller will follow up in about 12 months or sooner if needed based on labs or symptoms.  I verified that if his mom called I could update her. He asked that I not reach out to her directly.     Electronically signed by Richarda Blade, MD at 11/16/2016 3:03 PM EDT   Plan of Treatment - documented as of this encounter Not on file   Procedures - documented in this encounter Procedure Name Priority Date/Time Associated Diagnosis Comments  PROTEIN / CREATININE RATIO, URINE Routine 11/16/2016 2:35 PM EDT IgA nephropathy  Results for this procedure are in the results section.   BASIC METABOLIC PANEL Routine 16/05/9603 2:35 PM EDT IgA nephropathy  Results for this procedure are in the results section.    Lab Results - documented in this encounter Table of Contents for Lab Results  Protein/Creatinine Ratio, Urine (11/16/2016 2:35 PM EDT)  Basic metabolic panel (54/04/8118 2:35 PM EDT)     Protein/Creatinine Ratio, Urine (11/16/2016 2:35 PM EDT) Protein/Creatinine Ratio, Urine (11/16/2016 2:35  PM EDT)  Component Value Ref Range Performed At Pathologist Signature  Creat U 84.6 Undefined mg/dL [facility]   Protein, Ur 6.0 Comment:   This test was developed and its performance characteristics determined by the [facility] [employer] [employer]. This test has not been cleared or approved by the FDA. The laboratory is regulated under CAP and CLIA as qualified to perform high-complexity testing. This test is to be used for clinical purposes and should not be regarded as investigational or for research. Results should be interpreted in context with other laboratory and clinical data. Undefined mg/dL [facility]   Protein/Creatinine Ratio, Urine 0.071 Undefined [facility]    Protein/Creatinine Ratio, Urine (11/16/2016 2:35 PM EDT)  Specimen  Urine   Protein/Creatinine Ratio, Urine (11/16/2016 2:35 PM EDT)  Narrative Performed At    This test was developed and its performance characteristics determined by the [facility] the [employer] [employer]. This test has not been cleared or approved by the FDA. The laboratory is regulated under CAP and CLIA as qualified to perform high-complexity testing. This test is to be used for clinical purposes and should not be regarded as investigational or for research. Results should be interpreted in context with other laboratory and clinical data.  [facility]    Protein/Creatinine Ratio, Urine (11/16/2016 2:35 PM EDT)  Performing Organization Address City/State/Zipcode Phone Number  [place]  [address]  [place], [place] [address] [phone]   Back to top of Lab Results    Basic metabolic panel (78/46/9629 2:35 PM EDT) Basic metabolic panel (52/84/1324 2:35 PM EDT)  Component Value Ref Range Performed At Pathologist Signature  Sodium 141 135 -  145 mmol/L [facility]   Potassium 3.8 3.5 - 5.0 mmol/L [facility]   Chloride 102 98 - 107 mmol/L [facility]   CO2 27.0 22.0 - 30.0 mmol/L [facility]   BUN 10 7 - 21 mg/dL [facility]   Creatinine 0.93 0.70 - 1.30 mg/dL [facility]   BUN/Creatinine Ratio 11  [facility]   EGFR MDRD Non Af Amer >=60 >=60 mL/min/1.59m [facility]   EGFR MDRD Af Amer >=60 >=60 mL/min/[facility]   Anion Gap 12 9 - 15 mmol/L [facility]   Glucose 97 65 - 179 mg/dL [facility]   Calcium 9.6 8.5 - 10.2 mg/dL [facility]    Basic metabolic panel (0440/05/2724:35 PM EDT)  Specimen  Blood   Basic metabolic panel (0436/64/4034:35 PM EDT)  Performing Organization Address City/State/Zipcode Phone Number  [place] [phone] Back to top of Lab Results  Visit Diagnoses - documented in this encounter Diagnosis  IgA nephropathy - Primary  Nephritis and nephropathy, not specified as acute or chronic, with unspecified pathological lesion in kidney   Renal cyst  Unspecified congenital cystic kidney disease   Images  Patient Contacts  Contact Name Contact Address Communication Relationship to Patient  Unknown Unknown Unknown Emergency Contact  Document Information  Primary Care Provider Other Service Providers Document Coverage Dates  Vista Mink, [place] (Jun. 08, 2016June 08, 2016 - Present) [phone] (Work) [phone] ([address])  Laurian Brim, [place] (Work) [phone] (Fax) [place] [facility] [place], [place] [address] [facility]  [address] [place], [place] [address] Apr. 05, 2018April 05, 2018   [place] [address] [place], [place] [address]   Encounter Providers Encounter Date  Richarda Blade, MD (Attending) [phone] (Work) [phone] (Fax) [place] [address] [place], [place] [address] Nephrology Apr. 05, 2018April 05, 2018

## 2018-08-09 NOTE — Patient Instructions (Signed)
Your procedure is scheduled on: 08-15-17 THURSDAY Report to Same Day Surgery 2nd floor medical mall [facility](Medical Mall Entrance-take elevator on left to 2nd floor.  Check in with surgery information desk.) To find out your arrival time please call [phone] between 1PM - 3PM on 08-13-18 TUESDAY  Remember: Instructions that are not followed completely may result in serious medical risk, up to and including death, or upon the discretion of your surgeon and anesthesiologist your surgery may need to be rescheduled.    _x___ 1. Do not eat food after midnight the night before your procedure. NO GUM OR CANDY AFTER MIDNIGHT. You may drink clear liquids up to 2 hours before you are scheduled to arrive at the hospital for your procedure.  Do not drink clear liquids within 2 hours of your scheduled arrival to the hospital.  Clear liquids include  --Water or Apple juice without pulp  --Clear carbohydrate beverage such as ClearFast or Gatorade  --Black Coffee or Clear Tea (No milk, no creamers, do not add anything to the coffee or Tea)   ____Ensure clear carbohydrate drink on the way to the hospital for bariatric patients  ____Ensure clear carbohydrate drink 3 hours before surgery for Dr Rutherford NailByrnett's patients if physician instructed.     __x__ 2. No Alcohol for 24 hours before or after surgery.   __x__3. No Smoking or e-cigarettes for 24 prior to surgery.  Do not use any chewable tobacco products for at least 6 hour prior to surgery   ____  4. Bring all medications with you on the day of surgery if instructed.    __x__ 5. Notify your doctor if there is any change in your medical condition     (cold, fever, infections).    x___6. On the morning of surgery brush your teeth with toothpaste and water.  You may rinse your mouth with mouth wash if you wish.  Do not swallow any toothpaste or mouthwash.   Do not wear jewelry, make-up, hairpins, clips or nail polish.  Do not wear lotions, powders, or perfumes.  You may wear deodorant.  Do not shave 48 hours prior to surgery. Men may shave face and neck.  Do not bring valuables to the hospital.    [facility] is not responsible for any belongings or valuables.               Contacts, dentures or bridgework may not be worn into surgery.  Leave your suitcase in the car. After surgery it may be brought to your room.  For patients admitted to the hospital, discharge time is determined by your treatment team.  _  Patients discharged the day of surgery will not be allowed to drive home.  You will need someone to drive you home and stay with you the night of your procedure.    Please read over the following fact sheets that you were given:   [facility] Preparing for [facility]   ____ Take anti-hypertensive listed below, cardiac, seizure, asthma, anti-reflux and psychiatric medicines. These include:  1. NONE  2.  3.  4.  5.  6.  ____Fleets enema or Magnesium Citrate as directed.   ____ Use CHG Soap or sage wipes as directed on instruction sheet   ____ Use inhalers on the day of surgery and bring to hospital day of surgery  ____ Stop Metformin and Janumet 2 days prior to surgery.    ____ Take 1/2 of usual insulin dose the night before surgery and none on  the morning surgery.   ____ Follow recommendations from Cardiologist, Pulmonologist or PCP regarding stopping Aspirin, Coumadin, Plavix ,Eliquis, Effient, or Pradaxa, and Pletal.  X____Stop Anti-inflammatories such as Advil, Aleve, Ibuprofen, Motrin, Naproxen, Naprosyn, Goodies powders or aspirin products NOW-OK to take Tylenol    ____ Stop supplements until after surgery.    ____ Bring C-Pap to the hospital.

## 2018-08-13 ENCOUNTER — Encounter
Admission: RE | Admit: 2018-08-13 | Discharge: 2018-08-13 | Disposition: A | Discharge status: Home / Self Care | Payer: BLUE CROSS/BLUE SHIELD | Source: Ambulatory Visit | Attending: Otolaryngology | Admitting: Otolaryngology

## 2018-08-13 DIAGNOSIS — Z01812 Encounter for preprocedural laboratory examination: Secondary | ICD-10-CM | POA: Insufficient documentation

## 2018-08-13 LAB — BASIC METABOLIC PANEL
ANION GAP: 6 (ref 5–15)
BUN: 12 mg/dL (ref 6–20)
CALCIUM: 9.7 mg/dL (ref 8.9–10.3)
CO2: 27 mmol/L (ref 22–32)
CREATININE: 0.84 mg/dL (ref 0.61–1.24)
Chloride: 103 mmol/L (ref 98–111)
Glucose, Bld: 97 mg/dL (ref 70–99)
Potassium: 3.8 mmol/L (ref 3.5–5.1)
Sodium: 136 mmol/L (ref 135–145)

## 2018-08-15 ENCOUNTER — Ambulatory Visit: Payer: BLUE CROSS/BLUE SHIELD | Admitting: Registered Nurse

## 2018-08-15 ENCOUNTER — Ambulatory Visit
Admission: RE | Admit: 2018-08-15 | Discharge: 2018-08-15 | Disposition: A | Discharge status: Home / Self Care | Payer: BLUE CROSS/BLUE SHIELD | Attending: Otolaryngology | Admitting: Otolaryngology

## 2018-08-15 ENCOUNTER — Encounter
Admission: RE | Disposition: A | Discharge status: Home / Self Care | Payer: Self-pay | Source: Home / Self Care | Attending: Otolaryngology

## 2018-08-15 ENCOUNTER — Encounter: Payer: Self-pay | Admitting: *Deleted

## 2018-08-15 ENCOUNTER — Other Ambulatory Visit: Payer: Self-pay

## 2018-08-15 DIAGNOSIS — J342 Deviated nasal septum: Secondary | ICD-10-CM | POA: Diagnosis not present

## 2018-08-15 DIAGNOSIS — J343 Hypertrophy of nasal turbinates: Secondary | ICD-10-CM | POA: Diagnosis not present

## 2018-08-15 DIAGNOSIS — Z87891 Personal history of nicotine dependence: Secondary | ICD-10-CM | POA: Insufficient documentation

## 2018-08-15 DIAGNOSIS — J301 Allergic rhinitis due to pollen: Secondary | ICD-10-CM | POA: Diagnosis not present

## 2018-08-15 HISTORY — PX: NASAL SEPTOPLASTY W/ TURBINOPLASTY: SHX2070

## 2018-08-15 SURGERY — SEPTOPLASTY, NOSE, WITH NASAL TURBINATE REDUCTION
Anesthesia: General | Laterality: Bilateral

## 2018-08-15 MED ORDER — OXYCODONE-ACETAMINOPHEN 5-325 MG PO TABS: 2.0000 | ORAL_TABLET | Freq: Four times a day (QID) | ORAL | Status: DC | PRN

## 2018-08-15 MED ORDER — OXYMETAZOLINE HCL 0.05 % NA SOLN
NASAL | Status: AC
  Filled 2018-08-15: qty 15

## 2018-08-15 MED ORDER — GLYCOPYRROLATE 0.2 MG/ML IJ SOLN
INTRAMUSCULAR | Status: DC | PRN
  Administered 2018-08-15: 0.2 mg via INTRAVENOUS

## 2018-08-15 MED ORDER — OXYMETAZOLINE HCL 0.05 % NA SOLN
NASAL | Status: DC | PRN
  Administered 2018-08-15: 1

## 2018-08-15 MED ORDER — LIDOCAINE-EPINEPHRINE 1 %-1:100000 IJ SOLN
INTRAMUSCULAR | Status: AC
  Filled 2018-08-15: qty 1

## 2018-08-15 MED ORDER — DEXAMETHASONE SODIUM PHOSPHATE 10 MG/ML IJ SOLN
INTRAMUSCULAR | Status: DC | PRN
  Administered 2018-08-15: 10 mg via INTRAVENOUS

## 2018-08-15 MED ORDER — PROPOFOL 10 MG/ML IV BOLUS
INTRAVENOUS | Status: DC | PRN
  Administered 2018-08-15: 160 mg via INTRAVENOUS

## 2018-08-15 MED ORDER — FAMOTIDINE 20 MG PO TABS
20.0000 mg | ORAL_TABLET | Freq: Once | ORAL | Status: AC
  Administered 2018-08-15: 20 mg via ORAL

## 2018-08-15 MED ORDER — LIDOCAINE HCL (CARDIAC) PF 100 MG/5ML IV SOSY
PREFILLED_SYRINGE | INTRAVENOUS | Status: DC | PRN
  Administered 2018-08-15: 100 mg via INTRAVENOUS

## 2018-08-15 MED ORDER — AMOXICILLIN-POT CLAVULANATE 875-125 MG PO TABS: 1.0000 | ORAL_TABLET | Freq: Two times a day (BID) | ORAL | 0 refills | Status: DC

## 2018-08-15 MED ORDER — OXYCODONE-ACETAMINOPHEN 5-325 MG PO TABS: 1.0000 | ORAL_TABLET | Freq: Four times a day (QID) | ORAL | 0 refills | Status: AC | PRN

## 2018-08-15 MED ORDER — OXYCODONE-ACETAMINOPHEN 5-325 MG PO TABS
ORAL_TABLET | ORAL | Status: AC
  Filled 2018-08-15: qty 1

## 2018-08-15 MED ORDER — SUGAMMADEX SODIUM 200 MG/2ML IV SOLN
INTRAVENOUS | Status: DC | PRN
  Administered 2018-08-15: 200 mg via INTRAVENOUS

## 2018-08-15 MED ORDER — ONDANSETRON HCL 4 MG PO TABS: 4.0000 mg | ORAL_TABLET | Freq: Three times a day (TID) | ORAL | 0 refills | Status: DC | PRN

## 2018-08-15 MED ORDER — DEXMEDETOMIDINE HCL IN NACL 200 MCG/50ML IV SOLN
INTRAVENOUS | Status: DC | PRN
  Administered 2018-08-15: 8 ug via INTRAVENOUS
  Administered 2018-08-15: 12 ug via INTRAVENOUS

## 2018-08-15 MED ORDER — FENTANYL CITRATE (PF) 100 MCG/2ML IJ SOLN
INTRAMUSCULAR | Status: DC | PRN
  Administered 2018-08-15 (×2): 50 ug via INTRAVENOUS

## 2018-08-15 MED ORDER — HYDROMORPHONE HCL 1 MG/ML IJ SOLN: 0.2500 mg | INTRAMUSCULAR | Status: DC | PRN

## 2018-08-15 MED ORDER — PROMETHAZINE HCL 25 MG/ML IJ SOLN: 6.2500 mg | INTRAMUSCULAR | Status: DC | PRN

## 2018-08-15 MED ORDER — OXYCODONE-ACETAMINOPHEN 5-325 MG PO TABS
1.0000 | ORAL_TABLET | Freq: Four times a day (QID) | ORAL | Status: DC | PRN
  Administered 2018-08-15: 1 via ORAL

## 2018-08-15 MED ORDER — ACETAMINOPHEN 10 MG/ML IV SOLN
INTRAVENOUS | Status: DC | PRN
  Administered 2018-08-15: 1000 mg via INTRAVENOUS

## 2018-08-15 MED ORDER — BACITRACIN ZINC 500 UNIT/GM EX OINT
TOPICAL_OINTMENT | CUTANEOUS | Status: AC
  Filled 2018-08-15: qty 28.35

## 2018-08-15 MED ORDER — ONDANSETRON HCL 4 MG/2ML IJ SOLN
INTRAMUSCULAR | Status: DC | PRN
  Administered 2018-08-15: 4 mg via INTRAVENOUS

## 2018-08-15 MED ORDER — LACTATED RINGERS IV SOLN
INTRAVENOUS | Status: DC
  Administered 2018-08-15 (×2): via INTRAVENOUS

## 2018-08-15 MED ORDER — ROCURONIUM BROMIDE 100 MG/10ML IV SOLN
INTRAVENOUS | Status: DC | PRN
  Administered 2018-08-15: 40 mg via INTRAVENOUS

## 2018-08-15 MED ORDER — MIDAZOLAM HCL 2 MG/2ML IJ SOLN
INTRAMUSCULAR | Status: DC | PRN
  Administered 2018-08-15: 2 mg via INTRAVENOUS

## 2018-08-15 MED ORDER — LIDOCAINE-EPINEPHRINE 1 %-1:100000 IJ SOLN
INTRAMUSCULAR | Status: DC | PRN
  Administered 2018-08-15: 5 mL

## 2018-08-15 SURGICAL SUPPLY — 28 items
BLADE SURG 15 STRL LF DISP TIS (BLADE) ×1 IMPLANT
BLADE SURG 15 STRL SS (BLADE) ×1
BNDG EYE OVAL (GAUZE/BANDAGES/DRESSINGS) ×1 IMPLANT
CANISTER SUCT 1200ML W/VALVE (MISCELLANEOUS) ×2 IMPLANT
CNTNR SPEC 2.5X3XGRAD LEK (MISCELLANEOUS)
COAG SUCT 10F 3.5MM HAND CTRL (MISCELLANEOUS) ×2 IMPLANT
CONT SPEC 4OZ STER OR WHT (MISCELLANEOUS)
CONTAINER SPEC 2.5X3XGRAD LEK (MISCELLANEOUS) IMPLANT
COVER WAND RF STERILE (DRAPES) ×1 IMPLANT
DRESSING NASL FOAM PST OP SINU (MISCELLANEOUS) IMPLANT
DRSG NASAL FOAM POST OP SINU (MISCELLANEOUS) ×2
GAUZE PACK 2X3YD (GAUZE/BANDAGES/DRESSINGS) ×1 IMPLANT
GLOVE BIO SURGEON STRL SZ7.5 (GLOVE) ×4 IMPLANT
GOWN STRL REUS W/ TWL LRG LVL3 (GOWN DISPOSABLE) ×2 IMPLANT
GOWN STRL REUS W/TWL LRG LVL3 (GOWN DISPOSABLE) ×2
LABEL OR SOLS (LABEL) ×1 IMPLANT
NS IRRIG 500ML POUR BTL (IV SOLUTION) ×2 IMPLANT
PACK HEAD/NECK (MISCELLANEOUS) ×2 IMPLANT
PATTIES SURGICAL .5 X3 (DISPOSABLE) ×2 IMPLANT
SOL ANTI-FOG 6CC FOG-OUT (MISCELLANEOUS) ×1 IMPLANT
SOL FOG-OUT ANTI-FOG 6CC (MISCELLANEOUS) ×1
SPLINT NASAL REUTER .5MM (MISCELLANEOUS) ×1 IMPLANT
SPLINT NASAL SEPTAL PRE-CUT (MISCELLANEOUS) IMPLANT
SUT CHROMIC 4 0 RB 1X27 (SUTURE) ×2 IMPLANT
SUT ETH BLK MONO 3 0 FS 1 12/B (SUTURE) ×2 IMPLANT
SYR 30ML LL (SYRINGE) ×2 IMPLANT
SYR 3ML LL SCALE MARK (SYRINGE) ×2 IMPLANT
WATER STERILE IRR 1000ML POUR (IV SOLUTION) ×2 IMPLANT

## 2018-08-15 NOTE — Discharge Instructions (Signed)

## 2018-08-15 NOTE — Anesthesia Procedure Notes (Signed)
Procedure Name: Intubation Date/Time: 08/15/2018 8:56 AM Performed by: Johnna Acosta, CRNA Pre-anesthesia Checklist: Patient identified, Emergency Drugs available, Suction available, Patient being monitored and Timeout performed Patient Re-evaluated:Patient Re-evaluated prior to induction Oxygen Delivery Method: Circle system utilized Preoxygenation: Pre-oxygenation with 100% oxygen Induction Type: IV induction Ventilation: Mask ventilation without difficulty Laryngoscope Size: Mac and 3 Grade View: Grade I Tube type: Oral Tube size: 7.5 mm Number of attempts: 1 Airway Equipment and Method: Stylet Placement Confirmation: ETT inserted through vocal cords under direct vision,  positive ETCO2 and breath sounds checked- equal and bilateral Secured at: 22 cm Tube secured with: Tape Dental Injury: Teeth and Oropharynx as per pre-operative assessment

## 2018-08-15 NOTE — Transfer of Care (Signed)
Immediate Anesthesia Transfer of Care Note  Patient: Louis Miller  Procedure(s) Performed: Procedure(s): NASAL SEPTOPLASTY WITH BILATERAL INFERIOR TURBINATE REDUCTION (Bilateral)  Patient Location: PACU  Anesthesia Type:General  Level of Consciousness: sedated  Airway & Oxygen Therapy: Patient Spontanous Breathing and Patient connected to face mask oxygen  Post-op Assessment: Report given to RN and Post -op Vital signs reviewed and stable  Post vital signs: Reviewed and stable  Last Vitals:  Vitals:   08/15/18 0808 08/15/18 1030  BP: 135/79 (!) 108/48  Pulse: 76 66  Resp: 20 18  Temp: (!) 36.1 C 36.9 C  SpO2: 100% 98%    Complications: No apparent anesthesia complications

## 2018-08-15 NOTE — H&P (Signed)
..  History and Physical paper copy reviewed and updated date of procedure and will be scanned into system.  Patient seen and examined.  

## 2018-08-15 NOTE — Anesthesia Postprocedure Evaluation (Signed)
Anesthesia Post Note  Patient: Louis Miller  Procedure(s) Performed: NASAL SEPTOPLASTY WITH BILATERAL INFERIOR TURBINATE REDUCTION (Bilateral )  Patient location during evaluation: PACU Anesthesia Type: General Level of consciousness: awake and alert Pain management: pain level controlled Vital Signs Assessment: post-procedure vital signs reviewed and stable Respiratory status: spontaneous breathing, nonlabored ventilation and respiratory function stable Cardiovascular status: blood pressure returned to baseline and stable Postop Assessment: no apparent nausea or vomiting Anesthetic complications: no     Last Vitals:  Vitals:   08/15/18 1120 08/15/18 1127  BP: 118/82 126/73  Pulse: 64 61  Resp: 20 18  Temp:  (!) 36.2 C  SpO2: 97% 99%    Last Pain:  Vitals:   08/15/18 1127  TempSrc: Other (Comment)  PainSc: 7                  Jovita Gamma

## 2018-08-15 NOTE — Anesthesia Preprocedure Evaluation (Addendum)
Anesthesia Evaluation  Patient identified by MRN, date of birth, ID band Patient awake    Reviewed: Allergy & Precautions, H&P , NPO status , Patient's Chart, lab work & pertinent test results  History of Anesthesia Complications (+) Family history of anesthesia reactionHistory of anesthetic complications: multiple family members with severe PONV.  Airway Mallampati: II       Dental  (+) Teeth Intact   Pulmonary neg COPD, former smoker,           Cardiovascular negative cardio ROS       Neuro/Psych H/o occipital neuralgia negative psych ROS   GI/Hepatic negative GI ROS, Neg liver ROS,   Endo/Other  negative endocrine ROS  Renal/GU Renal diseaseIgA nephropathy  negative genitourinary   Musculoskeletal   Abdominal   Peds  Hematology negative hematology ROS (+)   Anesthesia Other Findings Past Medical History: No date: Allergic rhinitis, cause unspecified No date: Chest pain, unspecified No date: Cough No date: Frequent nosebleeds     Comment:  daily No date: History of kidney stones 06/11/2013: Occipital neuralgia     Comment:  left No date: Proteinuria 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 No date: Renal disorder     Comment:  IgA nephropathy No date: Sprain of ankle, unspecified site No date: Tonsillitis  Past Surgical History: No date: RENAL BIOPSY  BMI    Body Mass Index:  28.62 kg/m      Reproductive/Obstetrics negative OB ROS                            Anesthesia Physical Anesthesia Plan  ASA: II  Anesthesia Plan: General ETT   Post-op Pain Management:    Induction:   PONV Risk Score and Plan: Ondansetron, Dexamethasone, Midazolam, Treatment may vary due to age or medical condition and Scopolamine patch - Pre-op  Airway Management Planned:   Additional Equipment:   Intra-op Plan:   Post-operative Plan:   Informed Consent: I have reviewed the patients  History and Physical, chart, labs and discussed the procedure including the risks, benefits and alternatives for the proposed anesthesia with the patient or authorized representative who has indicated his/her understanding and acceptance.   Dental Advisory Given  Plan Discussed with: Anesthesiologist, CRNA and Surgeon  Anesthesia Plan Comments:        Anesthesia Quick Evaluation

## 2018-08-15 NOTE — Anesthesia Post-op Follow-up Note (Signed)
Anesthesia QCDR form completed.        

## 2018-08-15 NOTE — Op Note (Signed)
..08/15/2018  10:13 AM    Louis Miller  [id number]    Pre-Op Dx:  Deviated Nasal Septum, Hypertrophic Inferior Turbinates  Post-op Dx: Same  Proc: Nasal Septoplasty, Bilateral Partial Reduction Inferior Turbinates   Surg:  Terik Haughey  Anes:  GOT  EBL:  50  Comp:  none  Findings: Significant bone and soft tissue inferior turbinate hypertrophy, s-shaped septal deviation  Procedure: With the patient in a comfortable supine position,  general orotracheal anesthesia was induced without difficulty.  The patient received preoperative Afrin spray for topical decongestion and vasoconstriction.  At an appropriate level, the patient was placed in a semi-sitting position.  Nasal vibrissae were trimmed.   1% Xylocaine with 1:100,000 epinephrine, 5 cc's, was infiltrated into the anterior floor of the nose, into the nasal spine region, into the membranous columella, and finally into the submucoperichondrial plane of the septum on both sides.  Several minutes were allowed for this to take effect.  Cottoniod pledgetts soaked in Afrin were placed into both nasal cavities and left while the patient was prepped and draped in the standard fashion.   A proper time-out was performed.  The materials were removed from the nose and observed to be intact and correct in number.  The nose was inspected with a headlight and zero degree endoscope with the findings as described above.  A left Killian incision was sharply executed and carried down to the caudal edge of the quadrangular cartilage with a 15 blade scapel.  A mucoperichondrial flap was elelvated along the quadrangular plate back to the bony-cartilaginous junction using caudal elevator and freer elevator. The mucoperiostium was then elevated along the ethmoid plate and the vomer. An itracartilagenous incision was made using the freer elevator and a contralateral mucoperichondiral flap was elevated using a freer elevator.  Care was taken to avoid  any large rents or opposing rents in the mucoperichondrial flap.  Boney spurs of the vomer and maxillary crest were removed with Takahashi forceps.  The area of cartilagenous deviation was removed with combination of freer elevator and Takahashi forceps creating a widely patent nasal cavity as well as resolution of obstruction from the cartilagenous deviation. The mucosal flaps were placed back into their anatomic position to allow visualization of the airways. The septum now sat in the midline with an improved airway.  A 4-0 Chromic was used to close the [place] incision as well.   The inferior turbinates were then inspected.  Under endoscopic visualization, the inferior turbinates were infractured bilaterally with a Therapist, nutritional.  A kelly clamp was attached to the anterior-inferior third of each inferior turbinate for approximately one minute.  Under endoscopic visualization, Tru-cutting forceps were used to remove the anterior-inferior third of each inferior turbinate.  Electrocautery was used to control bleeding in the area. The remaining turbinate was then outfractured to open up the airway further. There was no significant bleeding noted. The right turbinate was then trimmed and outfractured in a similar fashion.  The airways were then visualized and showed open passageways on both sides that were significantly improved compared to before surgery.       There was no signifcant bleeding. Nasal splints were applied to both sides of the septum using Xomed 0.104mm regular sized splints that were trimmed, and then held in position with a 3-0 Nylon through and through suture.  Stamberger sinufoam was placed along the cut edge of the inferior turbinates bilaterally.  The patient was turned back over to anesthesia, and awakened, extubated, and taken to  the PACU in satisfactory condition.  Dispo:   PACU to home  Plan: Ice, elevation, narcotic analgesia, steroid taper, and prophylactic antibiotics for  the duration of indwelling nasal foreign bodies.  We will reevaluate the patient in the office in 6 days and remove the septal splints.  Return to work in 10 days, strenuous activities in two weeks.   Sie Formisano 08/15/2018 10:13 AM

## 2018-10-11 DIAGNOSIS — L728 Other follicular cysts of the skin and subcutaneous tissue: Secondary | ICD-10-CM | POA: Diagnosis not present

## 2018-10-28 DIAGNOSIS — D485 Neoplasm of uncertain behavior of skin: Secondary | ICD-10-CM | POA: Diagnosis not present

## 2018-10-28 DIAGNOSIS — L308 Other specified dermatitis: Secondary | ICD-10-CM | POA: Diagnosis not present

## 2018-10-28 DIAGNOSIS — L298 Other pruritus: Secondary | ICD-10-CM | POA: Diagnosis not present

## 2018-11-04 DIAGNOSIS — G4730 Sleep apnea, unspecified: Secondary | ICD-10-CM | POA: Diagnosis not present

## 2019-01-29 ENCOUNTER — Other Ambulatory Visit: Payer: Self-pay | Admitting: Internal Medicine

## 2019-01-29 LAB — T3 UPTAKE: Serum Appearance: 23

## 2019-02-11 ENCOUNTER — Other Ambulatory Visit: Payer: Self-pay

## 2019-02-11 ENCOUNTER — Ambulatory Visit: Payer: Self-pay | Admitting: Internal Medicine

## 2019-02-11 ENCOUNTER — Encounter: Payer: Self-pay | Admitting: Internal Medicine

## 2019-02-11 ENCOUNTER — Other Ambulatory Visit: Payer: Self-pay | Admitting: Internal Medicine

## 2019-02-11 VITALS — BP 111/71 | HR 64 | Temp 98.2°F | Resp 14 | Ht 72.0 in | Wt 207.0 lb

## 2019-02-11 DIAGNOSIS — S46919A Strain of unspecified muscle, fascia and tendon at shoulder and upper arm level, unspecified arm, initial encounter: Secondary | ICD-10-CM | POA: Insufficient documentation

## 2019-02-11 DIAGNOSIS — S46012D Strain of muscle(s) and tendon(s) of the rotator cuff of left shoulder, subsequent encounter: Secondary | ICD-10-CM

## 2019-02-11 DIAGNOSIS — M25512 Pain in left shoulder: Secondary | ICD-10-CM

## 2019-02-11 NOTE — Progress Notes (Unsigned)
S - Presents for f/u for  shoulder pain Saw 6/9 and diagnosed with probable left shoulder strain - bicipital tendon with element of RC strain after picking a leaf blower off of a truck on 6/8.  He missed his f/u planned on 6/17, and noted he was on vacation from 6/21-6/28 at [facility]. 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   No Known Allergies   Meds reviewed: No current outpatient medications on file prior to visit.   No current facility-administered medications on file prior to visit.     Only tylenol prn   Tob - former smoker, quit 07/2016  O - NAD, masked  BP 111/71 (BP Location: Right Arm, Patient Position: Sitting, Cuff Size: Large)   Pulse 64   Temp 98.2 F (36.8 C) (Oral)   Resp 14   Ht 6' (1.829 m)   Wt 207 lb (93.9 kg)   SpO2 96%   BMI 28.07 kg/m    Sclera anicteric Left Shoulder - FROM on testing - noted mild discomfort at extremes of full abduction (loast 10-20 degrees), lesser with forward elevation  Apley's negative (more hesitant with from below)  Minimal tenderness surrounding the  A-C joint, tender at subacromial bursa region with palpation  Mild tenderness at anterior joint line/bicipital tendon region, NT posterior joint line  Apprehension sign neg  Impingement test mildly positive today  RC testing - supraspinatus with good strength and no pain testing    Lift-off test with adequate strength, + Pain with  testing    Internal rotation with good strength and no pain testing    External rotation with good strength and some discomfort with testing  Sensation intact deltoid and in UE diffusely to LT  Good UE strength including good grip strength with biceps strength good and not painful with testing, triceps strength also good wand NT with testing  No radicular signs in UE      Ass/Plan  1.  L shoulder pain - prob RC/bicipital tendon strain. Noted no limitations (except with trying to swim fast) on vacation last week and encouraging, doubt major RC tear, also doubt major labral tear injury, but may need ortho input/MRI if not improving as expected with below.  Ice liberally short term to continue   Can use tylenol prn   Refer to PT and RC strengthening program (he noted previously that his wife is a PT and she could help with that, and will get PT involved formally presently)  Stay active below shoulder level, not above and minimize any heavy lifting with the left UE until symptoms resolving and note for work given today with these rec's   F/u in 2 weeks, sooner prn

## 2019-02-26 ENCOUNTER — Other Ambulatory Visit: Payer: Self-pay

## 2019-02-26 ENCOUNTER — Ambulatory Visit: Payer: Self-pay | Admitting: Internal Medicine

## 2019-02-26 ENCOUNTER — Ambulatory Visit
Admission: RE | Admit: 2019-02-26 | Discharge: 2019-02-26 | Disposition: A | Discharge status: Home / Self Care | Payer: Self-pay | Source: Ambulatory Visit | Attending: Internal Medicine | Admitting: Internal Medicine

## 2019-02-26 DIAGNOSIS — S46912D Strain of unspecified muscle, fascia and tendon at shoulder and upper arm level, left arm, subsequent encounter: Secondary | ICD-10-CM

## 2019-02-26 NOTE — Progress Notes (Signed)
S - Presents for f/u for left shoulder pain, came about 30 minutes late for appt and seen. Saw 6/9 and on f/u 6/30 and diagnosed with probable left shoulder strain - bicipital tendon with element of RC strain after picking a leaf blower off of a truck on 6/8.  He missed his f/u planned on 6/17, and noted [place] [facility]. He noted minimal discomfort when trying to swim fast on vacation, but "other than that, not bother me" last visit.  In the last couple weeks, notes intermittent persistent left shoulder pain, occasionally still feels popping at times and feels it into his ear noted, wakes up at night when rolls onto shoulder at times and did last night and took ibuprofen and helps. I asked as he noted not supposed to take NSAIDs with his kidney disease and he stated he isn't and took the ibuprofen anyway. 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 Denies marked pain with these activities.  No further acute trauma noted since last visit.  Was not contacted by PT to get to PT as rec'ed last visit.  No h/o left shoulder injuries, stated was ambidextrous in past and is right handed Tried OTC tylenol prn before the ibuprofen more recently as he was rec'ed not to take ibuprofen (IgA nephropathy hx). 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   No current facility-administered medications on file prior to visit.     Tob - former smoker, quit 07/2016  O - NAD, masked  VS - 117/77, P - 65, RR - 12 (taken by myself in office with machine)  Left Shoulder - FROM on testing - still noted mild discomfort at extremes of full abduction (loast 10-20 degrees), lesser with forward elevation             Apley's negative   Minimal tenderness surrounding the  A-C joint persists, tender at subacromial bursa region with palpation             No marked tenderness at anterior joint line/bicipital tendon region, NT posterior joint line             Apprehension sign neg (although noted uncomfortable with crank)             Impingement test positive             RC testing - supraspinatus with good strength and mild pain testing                                     Lift-off test with adequate strength, + Pain with testing                                     Internal rotation with good strength and no pain testing                                     External rotation with good strength and some discomfort with testing at extreme of ext rotation  Sensation intact deltoid and in UE diffusely to LT             Good UE strength including good grip strength with biceps strength good and not painful with testing, triceps strength also good and NT with testing             No radicular signs in UE                                                  Ass/Plan  1.  L shoulder pain - still feel prob RC strain, doubt major RC tear, also doubt major labral tear injury. Not get PT involved yet as requested last visit. Still symptomatic as above  Feel best to get an x-ray today to ensure ok  Will ask for ortho opinion presently and referral written and informed Ann of referral to help get initiated (informed is thru worker's comp)             Ice liberally short term to continue              Can use tylenol prn and not ibuprofen             Can remain active below shoulder level, not above and minimize any heavy lifting with the left UE until symptoms resolving and note for work given prior with these rec's to continue  Await ortho opinion presently             F/u in 2 weeks, sooner prn

## 2019-02-26 NOTE — Patient Instructions (Addendum)
Please go today for the shoulder x-ray (card given with address)

## 2019-02-27 ENCOUNTER — Telehealth: Payer: Self-pay

## 2019-02-27 NOTE — Telephone Encounter (Signed)
Spoke with pt and he states that no one from workers comp called to set up PT. I advised him someone would call him to set up Ortho but since it has to go to workers comp it would probably not be our office. I also advised I am forwarding this to Lelon Frohlich so she can take care and follow up since she is currently handling the workers comp side of things. Delton See would call him

## 2019-02-27 NOTE — Telephone Encounter (Signed)
-----   Message from Towanda Malkin, MD sent at 02/27/2019  8:07 AM EDT ----- Patient not on MyChart. Could you please call him and let him know x-rays were good, no concerns noted. Referred to orthopedics and is a worker's comp so Lelon Frohlich told me they will contact him to see ortho.  Unfortunately, referred him to PT 2 weeks ago and he was never contacted before his f/u visit yesterday, never saw PT, so could we please f/u with him by tomorrow to make sure the referral to ortho is happening. Thanks.

## 2019-03-05 NOTE — Telephone Encounter (Signed)
Called Brendan to see if [employer] has scheduled his ortho referral.  States no one has contacted him.  Told him I will contact [employer].  Tried to reach his [employer] adjuster Marcie Bal Peeler & Beecher Mcardle) - unable to speak with either one.  Left a voice mail for one of them to contact us regarding the Ortho Referral the physician requested.  AMD

## 2019-03-10 ENCOUNTER — Ambulatory Visit: Payer: Self-pay | Admitting: Internal Medicine

## 2019-03-10 ENCOUNTER — Other Ambulatory Visit: Payer: Self-pay

## 2019-03-10 ENCOUNTER — Encounter: Payer: Self-pay | Admitting: Internal Medicine

## 2019-03-10 VITALS — BP 112/69 | HR 61 | Temp 98.0°F | Resp 14 | Ht 72.0 in | Wt 203.0 lb

## 2019-03-10 DIAGNOSIS — S46912D Strain of unspecified muscle, fascia and tendon at shoulder and upper arm level, left arm, subsequent encounter: Secondary | ICD-10-CM

## 2019-03-10 NOTE — Progress Notes (Signed)
S - Presentsfor f/u forleftshoulder pain, last saw July 15th and requested ortho input and he has an appt this afternoon. He never was contacted by PT after referred to them after our first visit 6/9.  Saw 6/9 and on f/u 6/30 and 7/15 and diagnosed with probable left shoulder/RC strain - after picking a leaf blower off of a truck on 6/8.   In the last couple weeks, notes intermittent persistent left shoulder pain, mainly at night now when rolls over onto the shoulder. Feels it within the shoulder joint. Took tylenol to help (as is not supposed to take NSAIDs with his kidney disease)  Is doing things at work below shoulder level only and tolerating that to date. Denies marked pain with these activities. Not doing more at home either.  No further acute trauma noted since last visit.  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   No current facility-administered medications on file prior to visit.      Tob - former smoker, quit 07/2016  O - NAD,masked  BP 112/69 (BP Location: Right Arm, Patient Position: Sitting, Cuff Size: Large)   Pulse 61   Temp 98 F (36.7 C) (Oral)   Resp 14   Ht 6' (1.829 m)   Wt 203 lb (92.1 kg)   SpO2 97%   BMI 27.53 kg/m    LeftShoulder (limjited exam today with ortho appt this afternoon) -FROMontesting -hesitant at extremes of abduction and forward flexion more than marked pain could do Apley's without marked pain Sensation intact deltoid and in UE diffusely to LT Good grip strength  No radicular signs in UE  Prior x-ray unremarkable  Ass/Plan  1. L shoulder pain - prob RC strain,  doubt major RC tear, also doubt major labral tear injury. Not get PT involved as was requested prior. Still symptomatic as above noted.               Will get ortho opinion today- main question is will MRI be needed given the sx's and time since first injury noted  Can still use tylenolprn and not ibuprofen Still remain active below shoulder level, not above and minimize any heavy lifting with the left UEuntil symptoms resolving and note for work given prior with these rec'sto continue         Await ortho opinion presently F/u in 2 weeks, sooner prn

## 2019-03-31 ENCOUNTER — Encounter: Payer: Self-pay | Admitting: Internal Medicine

## 2019-03-31 ENCOUNTER — Other Ambulatory Visit: Payer: Self-pay

## 2019-03-31 ENCOUNTER — Ambulatory Visit: Payer: Self-pay | Admitting: Internal Medicine

## 2019-03-31 VITALS — BP 124/77 | HR 74 | Temp 98.2°F | Resp 16 | Ht 72.0 in | Wt 203.0 lb

## 2019-03-31 DIAGNOSIS — S46912D Strain of unspecified muscle, fascia and tendon at shoulder and upper arm level, left arm, subsequent encounter: Secondary | ICD-10-CM

## 2019-03-31 NOTE — Progress Notes (Signed)
Per nurse when roomed patient - States he had appt 2 wks ago at [facility].  Doing PT in [place] at [facility] tx per week.  Had another xray done at [facility].  No follow-up scheduled.  States just referred to PT.  S - Presentsfor f/u forleftshoulder pain, last saw July 27th and requested ortho input and he had an appt that afternoon. He was told a likely strain and sent to PT and has had 2 visits last week and 2 more this week and to continue and ortho noted if not responding to  PT, will need an MRI, but not ordered after the first visit. They did do another x-ray per patient.  I first saw 6/9 andon f/u 6/30 and 7/15 anddiagnosed with probable left shoulder/RC strain - after picking a leaf blower off of a truck on 6/8 at work. I had ordered PT but was not arranged as I had ordered and then asked for orthopedic referral.  He still notes intermittent persistent left shoulder pain, still often at night now when rolls over onto the shoulder. Not much improved since PT started. Feels it within the shoulder joint and not hurt to press on the shoulder, mainly with motions. Not taken any meds to help last couple weeks.  Is still limited at work, ok doing things at work below shoulder level only and tolerating that to date. Denies marked pain with these activities. Not doing more at home either.  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   No current facility-administered medications on file prior to visit.      Tob - former smoker, quit 07/2016  O - NAD,masked  BP 124/77 (BP Location: Left Arm, Patient Position: Sitting, Cuff Size: Large)   Pulse 74   Temp 98.2 F (36.8 C)  (Oral)   Resp 16   Ht 6' (1.829 m)   Wt 203 lb (92.1 kg)   SpO2 98%   BMI 27.53 kg/m   Left shoulder - ROM testing - abduction limited above 100 degrees due to pain, could progress to within the last 10-20 degrees before stopped, Forward elevation to full and much less discomfort with this  Apley's negative, was hesitant but could do from above and below without significant pain  NT with palpation at A-C joint  NT anterior joint line, NT posterior joint line  NT subacromial bursa region (noted feels pain more deeper in this area)  Apprehension sign neg, although noted some discomfort with crank  Impingement test neg  RC testing - supraspinatus  with good strength and no pain testing    Lift-off test was painful with resistance    Internal rotation with good strength and no pain    External rotation with good strength and no pain  Good biceps strength and NT bicipital tendon anterior  Sensation intact deltoid and in UE diffusely to LT  Good UE strength including good grip strength  No radicular signs in UE (noted with an exercise at PT, occas would feel his hand tingling, was a strengthening RC exercise)  Prior x-ray unremarkable  Ass/Plan  1. Persistent L shoulder pain -did feel likely a prob RC strain,doubted major RC tear, also doubted major labral tear  injury. Did again today note concerns with this not improving and now PT is involved after orthopedic evaluation. Still symptomatic as above noted.    Do feel MRI is needed if not responding to PT, and no f/u appointment was set up with ortho per patient. Ok to give PT a few weeks and assess his response and will f/u in 3 weeks to re-assess Can still use tylenolprnand not ibuprofen with his hx and ice after exercises with PT rec'ed as well and can use prn pain Still remainactive below shoulder level, not above and minimize any heavy lifting  with the left UEuntil symptoms resolving and note for work givenpriorwith these rec'sto continue  Will need f/u with ortho if not improving with PT to get the MRI ordered and I noted this to him today F/u sooner prn  Currently trying to get records from [facility] visit, and have not been able to via Epic presently. May need to get that information sent to us via fax if unable to locate in Epic.

## 2019-03-31 NOTE — Progress Notes (Signed)
States he had appt 2 wks ago at [facility].  [facility] [place] [facility].  Had another xray done at [facility].  No follow-up scheduled.  States just referred to PT.  AMD

## 2019-04-16 ENCOUNTER — Telehealth: Payer: Self-pay | Admitting: Internal Medicine

## 2019-04-16 NOTE — Telephone Encounter (Signed)
Information given to Darletta Moll, BSN, RN, [place], [facility], the [place] [employer] worker.  AMD

## 2019-04-16 NOTE — Telephone Encounter (Signed)
Received a phone call from Louis Miller at PT who has been working with Louis Miller after he was referred to them after his ortho visit for PT for his shoulder. Louis Miller noted Louis Miller is not getting better as he would expect after at least 7 visits to date, and is concerned may have a labral injury. I agreed with him and do feel further imaging is needed. Louis Miller noted he will call [facility], the referring provider to help with next steps and I did note to Louis Miller that the patient may need to f/u here again as well. I thanked him for his input and hoping can get Louis Miller back to ortho and likely the needed further imaging in the very near future.

## 2019-04-20 ENCOUNTER — Emergency Department
Admission: EM | Admit: 2019-04-20 | Discharge: 2019-04-20 | Disposition: A | Discharge status: Home / Self Care | Payer: 59 | Attending: Emergency Medicine | Admitting: Emergency Medicine

## 2019-04-20 ENCOUNTER — Encounter: Payer: Self-pay | Admitting: Emergency Medicine

## 2019-04-20 ENCOUNTER — Other Ambulatory Visit: Payer: Self-pay

## 2019-04-20 DIAGNOSIS — Z87891 Personal history of nicotine dependence: Secondary | ICD-10-CM | POA: Diagnosis not present

## 2019-04-20 DIAGNOSIS — R079 Chest pain, unspecified: Secondary | ICD-10-CM | POA: Diagnosis not present

## 2019-04-20 DIAGNOSIS — R0789 Other chest pain: Secondary | ICD-10-CM | POA: Diagnosis not present

## 2019-04-20 LAB — BASIC METABOLIC PANEL
Anion gap: 8 (ref 5–15)
BUN: 10 mg/dL (ref 6–20)
CO2: 29 mmol/L (ref 22–32)
Calcium: 9.4 mg/dL (ref 8.9–10.3)
Chloride: 101 mmol/L (ref 98–111)
Creatinine, Ser: 1.02 mg/dL (ref 0.61–1.24)
GFR calc Af Amer: >60 mL/min (ref >60)
GFR calc non Af Amer: >60 mL/min (ref >60)
Glucose, Bld: 82 mg/dL (ref 70–99)
Potassium: 3.7 mmol/L (ref 3.5–5.1)
Sodium: 138 mmol/L (ref 135–145)

## 2019-04-20 LAB — CBC
HCT: 43.7 % (ref 39.0–52.0)
Hemoglobin: 14.8 g/dL (ref 13.0–17.0)
MCH: 28.6 pg (ref 26.0–34.0)
MCHC: 33.9 g/dL (ref 30.0–36.0)
MCV: 84.4 fL (ref 80.0–100.0)
Platelets: 279 10*3/uL (ref 150–400)
RBC: 5.18 MIL/uL (ref 4.22–5.81)
RDW: 12.3 % (ref 11.5–15.5)
WBC: 5.7 10*3/uL (ref 4.0–10.5)
nRBC: 0 % (ref 0.0–0.2)

## 2019-04-20 LAB — TROPONIN I (HIGH SENSITIVITY): Troponin I (High Sensitivity): 3 ng/L (ref <18)

## 2019-04-20 MED ORDER — SODIUM CHLORIDE 0.9% FLUSH: 3.0000 mL | Freq: Once | INTRAVENOUS | Status: DC

## 2019-04-20 MED ORDER — ALUM & MAG HYDROXIDE-SIMETH 200-200-20 MG/5ML PO SUSP
30.0000 mL | Freq: Once | ORAL | Status: AC
  Administered 2019-04-20: 30 mL via ORAL
  Filled 2019-04-20: qty 30

## 2019-04-20 MED ORDER — LIDOCAINE VISCOUS HCL 2 % MT SOLN
15.0000 mL | Freq: Once | OROMUCOSAL | Status: AC
  Administered 2019-04-20: 15 mL via ORAL
  Filled 2019-04-20: qty 15

## 2019-04-20 NOTE — Discharge Instructions (Addendum)
Please seek medical attention for any high fevers, chest pain, shortness of breath, change in behavior, persistent vomiting, bloody stool or any other new or concerning symptoms.  

## 2019-04-20 NOTE — ED Provider Notes (Signed)
[facility] Emergency Department Provider Note  ____________________________________________   I have reviewed the triage vital signs and the nursing notes.   HISTORY  Chief Complaint Chest Pain   History limited by: Not Limited   HPI Louis Miller is a 23 y.o. male who presents to the emergency department today because of concerns for chest pain.  Patient states that it occurred tonight while he was eating.  Pain was located in his left chest.  Scribes it is more of a cramping type pain.  Did have some radiation into his left arm.  Denies any associated shortness of breath or diaphoresis.  Denies any associated nausea or vomiting.  Time of my exam the patient's pain had improved.  Has had similar pain in the past.  Last had a couple of weeks ago while he was playing a video game.  Says that he was seen for it last year and followed up with cardiologist.   Records reviewed. Per medical record review patient has a history of ER visit last year for chest pain.   Past Medical History:  Diagnosis Date  . Allergic rhinitis, cause unspecified   . Chest pain, unspecified   . Cough   . Frequent nosebleeds    daily  . History of kidney stones   . Kidney disease   . Occipital neuralgia 06/11/2013   left  . Proteinuria   . 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 Renal disorder    IgA nephropathy  . Sprain of ankle, unspecified site   . Tonsillitis     Patient Active Problem List   Diagnosis Date Noted  . Shoulder strain 02/11/2019  . Chest pain 03/24/2018  . Occipital neuralgia 06/11/2013  . 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 NASAL SEPTOPLASTY W/ TURBINOPLASTY Bilateral 08/15/2018   Procedure: NASAL SEPTOPLASTY WITH BILATERAL INFERIOR TURBINATE REDUCTION;  Surgeon: Carloyn Manner, MD;  Location: [facility] ORS;  Service: ENT;  Laterality: Bilateral;  . RENAL BIOPSY      Prior to Admission medications    Not on File    Allergies Patient has no known allergies.  Family History  Problem Relation Age of Onset  . Migraines Mother   . Hypertension Mother   . Hypertension Father   . Pancreatic cancer Paternal Grandfather   . Breast cancer Maternal Great-grandmother     Social History Social History   Tobacco Use  . Smoking status: Former Smoker    Packs/day: 1.50    Years: 6.00    Pack years: 9.00    Types: Cigarettes    Quit date: 08/09/2016    Years since quitting: 2.6  . Smokeless tobacco: Former Network engineer Use Topics  . Alcohol use: Yes    Comment: occasional  . Drug use: No    Review of Systems Constitutional: No fever/chills Eyes: No visual changes. ENT: No sore throat. Cardiovascular: Positive for chest pain. Respiratory: Denies shortness of breath. Gastrointestinal: No abdominal pain.  No nausea, no vomiting.  No diarrhea.   Genitourinary: Negative for dysuria. Musculoskeletal: Negative for back pain. Skin: Negative for rash. Neurological: Negative for headaches, focal weakness or numbness.  ____________________________________________   PHYSICAL EXAM:  VITAL SIGNS: ED Triage Vitals [04/20/19 1731]  Enc Vitals Group     BP 134/81     Pulse Rate 77     Resp 17     Temp 97.9 F (36.6 C)  Temp Source Oral     SpO2 99 %     Weight 205 lb (93 kg)     Height 6' (1.829 m)   Constitutional: Alert and oriented.  Eyes: Conjunctivae are normal.  ENT      Head: Normocephalic and atraumatic.      Nose: No congestion/rhinnorhea.      Mouth/Throat: Mucous membranes are moist.      Neck: No stridor. Hematological/Lymphatic/Immunilogical: No cervical lymphadenopathy. Cardiovascular: Normal rate, regular rhythm.  No murmurs, rubs, or gallops.  Respiratory: Normal respiratory effort without tachypnea nor retractions. Breath sounds are clear and equal bilaterally. No wheezes/rales/rhonchi. Gastrointestinal: Soft and non tender. No rebound. No  guarding.  Genitourinary: Deferred Musculoskeletal: Normal range of motion in all extremities. No lower extremity edema. Neurologic:  Normal speech and language. No gross focal neurologic deficits are appreciated.  Skin:  Skin is warm, dry and intact. No rash noted. Psychiatric: Mood and affect are normal. Speech and behavior are normal. Patient exhibits appropriate insight and judgment.  ____________________________________________    LABS (pertinent positives/negatives)  Trop hs 3 BMP wnl CBC wbc 5.7, hgb 14.8, plt 279  ____________________________________________   EKG  I, Phineas SemenGraydon Macsen Nuttall, attending physician, personally viewed and interpreted this EKG  EKG Time: 1730 Rate: 75 Rhythm: normal sinus rhythm Axis: normal Intervals: qtc 395 QRS: narrow ST changes: no st elevation Impression: normal ekg ____________________________________________    RADIOLOGY  None ____________________________________________   PROCEDURES  Procedures  ____________________________________________   INITIAL IMPRESSION / ASSESSMENT AND PLAN / ED COURSE  Pertinent labs & imaging results that were available during my care of the patient were reviewed by me and considered in my medical decision making (see chart for details).   Patient presented to the emergency department today because of concerns for chest pain.  Patient has history of similar pain in the past most recently a few weeks ago.  Patient's troponin here without concerning elevation.  EKG and other blood work without concerning acute findings.  Discussed chest x-ray with patient.  He is not complaining of shortness of breath and lung auscultation is normal.  Given that he has had this pain in the past I doubt that the chest x-ray would show acute finding.  Patient was comfortable deferring at this time.  GI cocktail was given without any significant change to the patient's symptoms.  Discussed with patient importance of  follow-up with primary care.   ____________________________________________   FINAL CLINICAL IMPRESSION(S) / ED DIAGNOSES  Final diagnoses:  Atypical chest pain     Note: This dictation was prepared with Dragon dictation. Any transcriptional errors that result from this process are unintentional     Phineas SemenGoodman, Lee-Ann Gal, MD 04/20/19 2147

## 2019-04-20 NOTE — ED Notes (Addendum)
Blood drawn, urine obtained

## 2019-04-20 NOTE — ED Triage Notes (Signed)
Pt to ER with c/o left sided chest pain and left arm pain that started while eating.  Pt states has had same before and that he has been seen here for same and followed up with cardiology in the past for same.  Pt was told it was related to an acceleration in his HR and that he will "grow out of it".  Pt states pain is mostly resolved at this time.

## 2019-04-22 ENCOUNTER — Ambulatory Visit: Payer: Self-pay | Admitting: Internal Medicine

## 2019-04-22 ENCOUNTER — Other Ambulatory Visit: Payer: Self-pay

## 2019-04-22 ENCOUNTER — Encounter: Payer: Self-pay | Admitting: Internal Medicine

## 2019-04-22 VITALS — BP 127/80 | HR 77 | Temp 97.1°F | Resp 14 | Ht 72.0 in | Wt 205.0 lb

## 2019-04-22 DIAGNOSIS — S46912D Strain of unspecified muscle, fascia and tendon at shoulder and upper arm level, left arm, subsequent encounter: Secondary | ICD-10-CM

## 2019-04-22 NOTE — Progress Notes (Signed)
S - Patient presents for f/u of shoulder pain. Last saw 8/17 and had seen ortho, was in PT and I then received a call from PT involved as below:  Received a phone call from Louis Miller at PT who has been working with Louis Miller after he was referred to them after his ortho visit for PT for his shoulder. Louis Miller noted Louis Miller is not getting better as he would expect after at least 7 visits to date, and is concerned may have a labral injury. I agreed with him and do feel further imaging is needed. Louis Miller noted he will call [facility], the referring provider to help with next steps and I did note to Louis Miller that the patient may need to f/u here again as well. I thanked him for his input and hoping can get Louis Miller back to ortho and likely the needed further imaging in the very near future.   Louis Miller notes that his shoulder has not improved much at all and still keeps him up at nights, usually wakes up when rolls onto shoulder and painful and struggles then to get back to sleep.  He noted he picked up two meds after his prior [facility] visit and has not used them. One was muscle relaxer and one was mobic.  He has been riding a mower at work and not doing any increased activities with his LUE He has an appt with ortho again this afternoon to re-assess  Prior notes reviewed  Did note he was in the ER 9/6 with CP and that note was reviewed, he was seen with something similar about a year prior noted and he f/u'ed with cardiologist. The ER rec'ed further f/u with a doctor after discharge and I asked if he was planning to do so and he said yes, and I noted the importance of that as well today.  No Known Allergies No current outpatient medications on file prior to visit.   No current facility-administered medications on file prior to visit.    Former smoker and smokeless user  O - NAD, masked  BP 127/80 (BP Location: Right Arm, Patient Position: Sitting, Cuff Size: Large)   Pulse 77   Temp (!) 97.1 F  (36.2 C) (Oral)   Resp 14   Ht 6' (1.829 m)   Wt 205 lb (93 kg)   SpO2 96%   BMI 27.80 kg/m   Affect was not flat, approp with conversation Left shoulder not re-examined today with f/u with ortho planned this afternoon.  A/P: 1. Left shoulder pain - Do feel MRI is likely needed as not responding to PT,    Canstilluse tylenolprnand not ibuprofen with his kidney disease hx and ice after exercises with PT as they recommend as well. Also rec'ed the muscle relaxer to use at bedtime to help him overnight when sx's seem more problematic.  Stillremainactive below shoulder level, not above and still minimize any heavy lifting with the left UEuntil symptoms resolving and note for work givenpriorwith these rec'sto continue   f/u with ortho as planned later today and await their input   2. Recent ER visit noted on Sunday, emphasized f/u visit for that needed in the near future pending his clinical status (if sx's not resolving or recurring)

## 2019-05-20 ENCOUNTER — Other Ambulatory Visit: Payer: Self-pay

## 2019-05-20 ENCOUNTER — Other Ambulatory Visit: Payer: Self-pay | Admitting: Occupational Medicine

## 2019-05-20 ENCOUNTER — Ambulatory Visit
Admission: RE | Admit: 2019-05-20 | Discharge: 2019-05-20 | Disposition: A | Discharge status: Home / Self Care | Payer: 59 | Source: Ambulatory Visit | Attending: Occupational Medicine | Admitting: Occupational Medicine

## 2019-05-20 ENCOUNTER — Encounter: Payer: Self-pay | Admitting: Occupational Medicine

## 2019-05-20 ENCOUNTER — Ambulatory Visit: Payer: Self-pay | Admitting: Occupational Medicine

## 2019-05-20 DIAGNOSIS — M542 Cervicalgia: Secondary | ICD-10-CM

## 2019-05-20 DIAGNOSIS — M546 Pain in thoracic spine: Secondary | ICD-10-CM

## 2019-05-20 DIAGNOSIS — M549 Dorsalgia, unspecified: Secondary | ICD-10-CM | POA: Insufficient documentation

## 2019-05-20 MED ORDER — KETOROLAC TROMETHAMINE 60 MG/2ML IM SOLN
60.0000 mg | Freq: Once | INTRAMUSCULAR | Status: AC
  Administered 2019-05-20: 60 mg via INTRAMUSCULAR

## 2019-05-20 MED ORDER — KETOROLAC TROMETHAMINE 30 MG/ML IJ SOLN: 30.0000 mg | Freq: Once | INTRAMUSCULAR | Status: DC

## 2019-05-20 NOTE — Progress Notes (Signed)
[place] out of tree stand on 05/18/2019.  Immediately started having neck pain & back pain from neck to mid back. Denies any shoulder/arm/leg discomfort.  Denies numbness/tingling of extremities.  Denies hitting head.  Didn't work yesterday.  States been taking Ibuprofen 400 mg q 6 hrs along with heat & ice.  AMD

## 2020-01-14 IMAGING — CR DG CHEST 2V
1 series · 2 of 2 positions shown · non-contrast
Comparison: Radiographs June 19, 2014.

CLINICAL DATA: Chest pain.

EXAM:
CHEST - 2 VIEW

[Series 1: dg chest 2 view · 0.14mm/px · 2 of 2 slices shown]
[im 1/2]
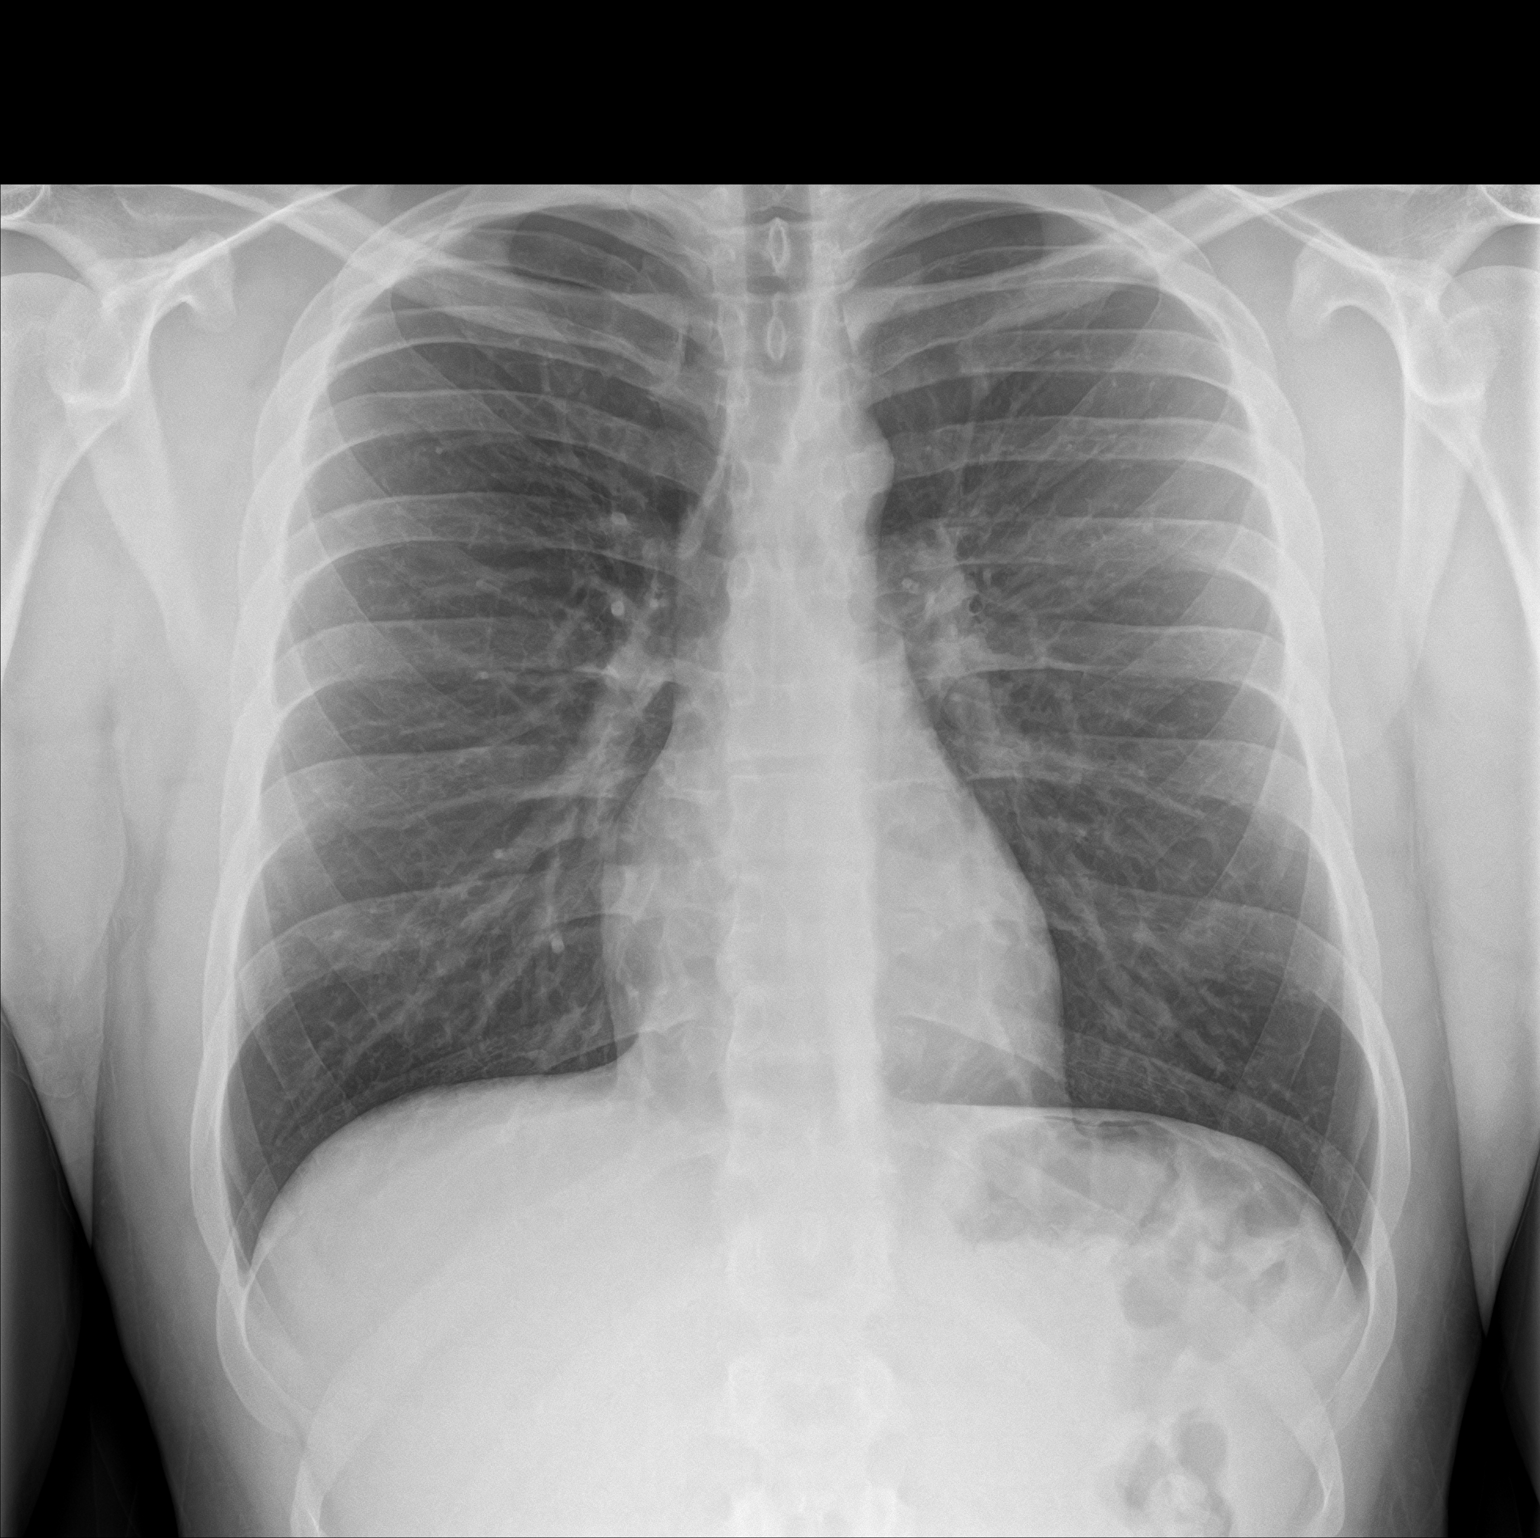
[im 2/2]
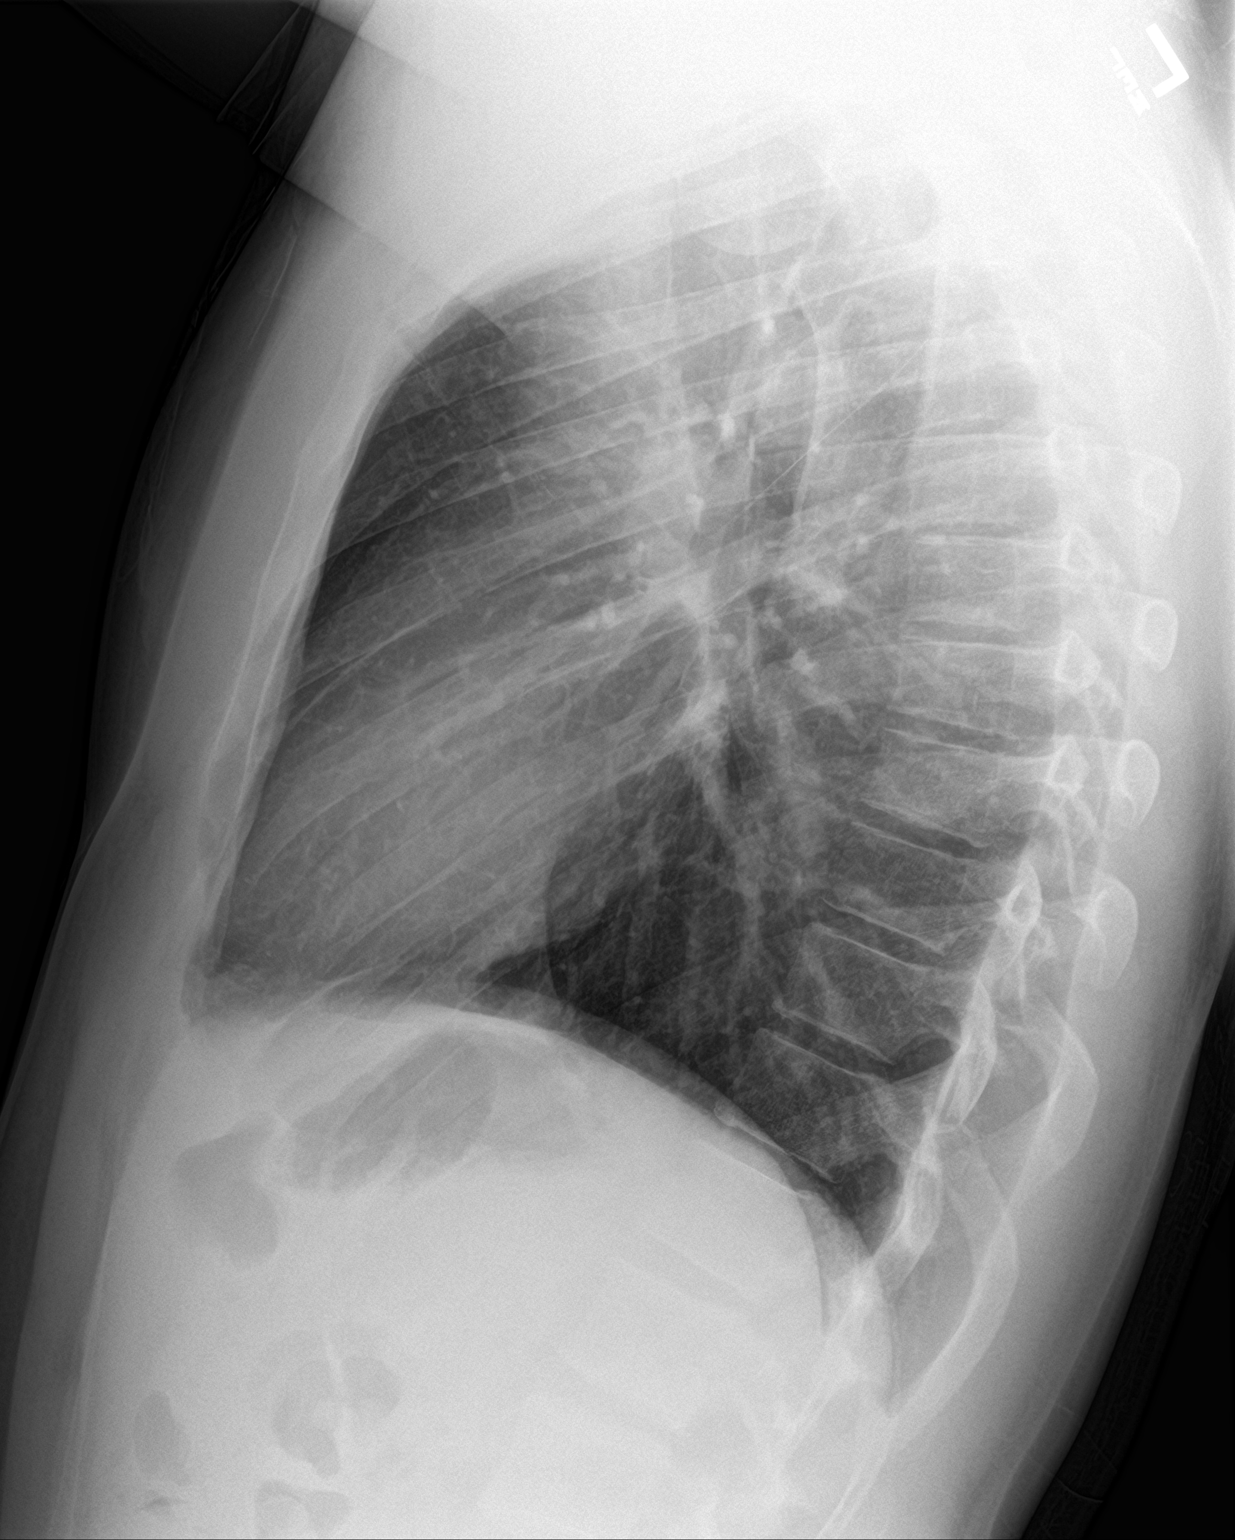

[2 of 2 positions shown; findings below may reference images not displayed]

FINDINGS: The heart size and mediastinal contours are within normal limits.
Both lungs are clear. No pneumothorax or pleural effusion is noted.
The visualized skeletal structures are unremarkable.
IMPRESSION: No active cardiopulmonary disease.

## 2021-11-14 ENCOUNTER — Encounter: Payer: Self-pay | Admitting: Emergency Medicine

## 2021-11-14 ENCOUNTER — Ambulatory Visit
Admission: EM | Admit: 2021-11-14 | Discharge: 2021-11-14 | Disposition: A | Discharge status: Home / Self Care | Payer: 59 | Attending: Emergency Medicine | Admitting: Emergency Medicine

## 2021-11-14 ENCOUNTER — Ambulatory Visit (INDEPENDENT_AMBULATORY_CARE_PROVIDER_SITE_OTHER): Payer: Self-pay

## 2021-11-14 DIAGNOSIS — M25562 Pain in left knee: Secondary | ICD-10-CM

## 2021-11-14 DIAGNOSIS — M25462 Effusion, left knee: Secondary | ICD-10-CM

## 2021-11-14 MED ORDER — IBUPROFEN 600 MG PO TABS: 600.0000 mg | ORAL_TABLET | Freq: Four times a day (QID) | ORAL | 0 refills | Status: DC | PRN

## 2021-11-14 NOTE — ED Triage Notes (Signed)
Pt slipped and injured his left knee yesterday.  ?

## 2021-11-14 NOTE — ED Provider Notes (Signed)
?[facility] ? ? ? ?CSN: [id number] ?Arrival date & time: 11/14/21  0844 ? ? ?  ? ?History   ?Chief Complaint ?Chief Complaint  ?Patient presents with  ? Knee Pain  ?  Entered by patient  ? ? ?HPI ?Lissa MerlinGriffen L Marksberry is a 26 y.o. male.  Patient presents with pain and swelling of his left knee after he slipped and hyperextended his knee yesterday.  He is concerned for possible dislocation.  No open wounds, redness, bruising, numbness, weakness, or other symptoms.  Treatment at home with Tylenol.  His medical history includes nephropathy, kidney stones, occipital neuralgia.  He also reports remote history of partial tear of MCL but cannot remember which knee it was; [facility]. ? ?The history is provided by the patient and medical records.  ? ?Past Medical History:  ?Diagnosis Date  ? Allergic rhinitis, cause unspecified   ? Chest pain, unspecified   ? Cough   ? Frequent nosebleeds   ? daily  ? History of kidney stones   ? Kidney disease   ? Occipital neuralgia 06/11/2013  ? left  ? Proteinuria   ? 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 Renal disorder   ? IgA nephropathy  ? Sprain of ankle, unspecified site   ? Tonsillitis   ? ? ?Patient Active Problem List  ? Diagnosis Date Noted  ? Back pain 05/20/2019  ? Neck pain 05/20/2019  ? Shoulder strain 02/11/2019  ? Chest pain 03/24/2018  ? Mass of occipital region 08/04/2013  ? Occipital neuralgia 06/11/2013  ? 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 Proteinuria 10/22/2012  ? IgA nephropathy 10/20/2011  ? ? ?Past Surgical History:  ?Procedure Laterality Date  ? NASAL SEPTOPLASTY W/ TURBINOPLASTY Bilateral 08/15/2018  ? Procedure: NASAL SEPTOPLASTY WITH BILATERAL INFERIOR TURBINATE REDUCTION;  Surgeon: Bud FaceVaught, Creighton, MD;  Location: [facility] ORS;  Service: ENT;  Laterality: Bilateral;  ? RENAL BIOPSY    ? ? ? ? ? ?Home Medications   ? ?Prior to Admission medications   ?Medication Sig Start Date End Date Taking? Authorizing Provider  ?ibuprofen  (ADVIL) 600 MG tablet Take 1 tablet (600 mg total) by mouth every 6 (six) hours as needed. 11/14/21  Yes Mickie Bailate, Marinna Blane H, NP  ? ? ?Family History ?Family History  ?Problem Relation Age of Onset  ? Migraines Mother   ? Hypertension Mother   ? Hypertension Father   ? Pancreatic cancer Paternal Grandfather   ? Breast cancer Maternal Great-grandmother   ? ? ?Social History ?Social History  ? ?Tobacco Use  ? Smoking status: Former  ?  Packs/day: 1.50  ?  Years: 6.00  ?  Pack years: 9.00  ?  Types: Cigarettes  ?  Quit date: 08/09/2016  ?  Years since quitting: 5.2  ? Smokeless tobacco: Current  ?  Types: Chew  ?Vaping Use  ? Vaping Use: Former  ? Substances: Nicotine  ?Substance Use Topics  ? Alcohol use: Yes  ?  Comment: occasional  ? Drug use: No  ? ? ? ?Allergies   ?Patient has no known allergies. ? ? ?Review of Systems ?Review of Systems  ?Musculoskeletal:  Positive for arthralgias, gait problem and joint swelling.  ?Skin:  Negative for color change, rash and wound.  ?Neurological:  Negative for numbness.  ?All other systems reviewed and are negative. ? ? ?Physical Exam ?Triage Vital Signs ?ED Triage Vitals  ?Enc Vitals Group  ?   BP   ?  Pulse   ?   Resp   ?   Temp   ?   Temp src   ?   SpO2   ?   Weight   ?   Height   ?   Head Circumference   ?   Peak Flow   ?   Pain Score   ?   Pain Loc   ?   Pain Edu?   ?   Excl. in GC?   ? ?No data found. ? ?Updated Vital Signs ?BP 136/85   Pulse 82   Temp 98.1 ?F (36.7 ?C)   Resp 18   SpO2 97%  ? ?Visual Acuity ?Right Eye Distance:   ?Left Eye Distance:   ?Bilateral Distance:   ? ?Right Eye Near:   ?Left Eye Near:    ?Bilateral Near:    ? ?Physical Exam ?Vitals and nursing note reviewed.  ?Constitutional:   ?   General: He is not in acute distress. ?   Appearance: He is well-developed. He is not ill-appearing.  ?HENT:  ?   Mouth/Throat:  ?   Mouth: Mucous membranes are moist.  ?Cardiovascular:  ?   Rate and Rhythm: Normal rate and regular rhythm.  ?   Heart sounds: Normal heart  sounds.  ?Pulmonary:  ?   Effort: Pulmonary effort is normal. No respiratory distress.  ?   Breath sounds: Normal breath sounds.  ?Musculoskeletal:     ?   General: Swelling present. No tenderness or deformity. Normal range of motion.  ?   Cervical back: Neck supple.  ?     Legs: ? ?Skin: ?   General: Skin is warm and dry.  ?   Capillary Refill: Capillary refill takes less than 2 seconds.  ?   Findings: No bruising, erythema, lesion or rash.  ?Neurological:  ?   General: No focal deficit present.  ?   Mental Status: He is alert and oriented to person, place, and time.  ?   Sensory: No sensory deficit.  ?   Motor: No weakness.  ?   Gait: Gait abnormal.  ?Psychiatric:     ?   Mood and Affect: Mood normal.     ?   Behavior: Behavior normal.  ? ? ? ?UC Treatments / Results  ?Labs ?(all labs ordered are listed, but only abnormal results are displayed) ?Labs Reviewed - No data to display ? ?EKG ? ? ?Radiology ?DG Knee Complete 4 Views Left ? ?Result Date: 11/14/2021 ?CLINICAL DATA:  Slipped and injured left knee yesterday. EXAM: LEFT KNEE - COMPLETE 4+ VIEW COMPARISON:  None. FINDINGS: No evidence of fracture, dislocation, or joint effusion. No evidence of arthropathy or other focal bone abnormality. Soft tissues are unremarkable. IMPRESSION: Negative. Electronically Signed   By: Obie Dredge M.D.   On: 11/14/2021 09:44   ? ?Procedures ?Procedures (including critical care time) ? ?Medications Ordered in UC ?Medications - No data to display ? ?Initial Impression / Assessment and Plan / UC Course  ?I have reviewed the triage vital signs and the nursing notes. ? ?Pertinent labs & imaging results that were available during my care of the patient were reviewed by me and considered in my medical decision making (see chart for details). ? ?Pain and swelling of left knee.  X-ray negative.  Treating with rest, elevation, ice packs, ibuprofen, knee sleeve, crutches.  Instructed patient to follow-up with orthopedist.  He agrees to  plan of care. ? ? ?Final Clinical Impressions(s) /  UC Diagnoses  ? ?Final diagnoses:  ?Pain and swelling of left knee  ? ? ? ?Discharge Instructions   ? ?  ?Take the ibuprofen as prescribed.  Rest and elevate your knee.  Apply ice packs 2-3 times a day for up to 20 minutes each.  Wear the knee sleeve and use the crutches.   ? ?Follow up with an orthopedist.   ? ? ? ? ? ?ED Prescriptions   ? ? Medication Sig Dispense Auth. Provider  ? ibuprofen (ADVIL) 600 MG tablet Take 1 tablet (600 mg total) by mouth every 6 (six) hours as needed. 30 tablet Mickie Bail, NP  ? ?  ? ?PDMP not reviewed this encounter. ?  ?Mickie Bail, NP ?11/14/21 1001 ? ?

## 2021-11-14 NOTE — Discharge Instructions (Addendum)
Take the ibuprofen as prescribed.  Rest and elevate your knee.  Apply ice packs 2-3 times a day for up to 20 minutes each.  Wear the knee sleeve and use the crutches.   ? ?Follow up with an orthopedist.   ? ?

## 2023-01-11 ENCOUNTER — Inpatient Hospital Stay (HOSPITAL_COMMUNITY): Payer: Self-pay

## 2023-01-11 ENCOUNTER — Emergency Department (HOSPITAL_COMMUNITY): Payer: Self-pay

## 2023-01-11 ENCOUNTER — Other Ambulatory Visit: Payer: Self-pay

## 2023-01-11 ENCOUNTER — Inpatient Hospital Stay (HOSPITAL_COMMUNITY): Payer: Self-pay | Admitting: Anesthesiology

## 2023-01-11 ENCOUNTER — Encounter (HOSPITAL_COMMUNITY): Payer: Self-pay

## 2023-01-11 ENCOUNTER — Encounter (HOSPITAL_COMMUNITY)
Admission: EM | Disposition: A | Discharge status: Home / Self Care | Payer: Self-pay | Source: Home / Self Care | Attending: Student

## 2023-01-11 ENCOUNTER — Inpatient Hospital Stay (HOSPITAL_COMMUNITY)
Admission: EM | Admit: 2023-01-11 | Discharge: 2023-01-12 | DRG: 493 | Disposition: A | Discharge status: Home / Self Care | Payer: Self-pay | Attending: Student | Admitting: Student

## 2023-01-11 DIAGNOSIS — F17290 Nicotine dependence, other tobacco product, uncomplicated: Secondary | ICD-10-CM | POA: Diagnosis present

## 2023-01-11 DIAGNOSIS — Z8249 Family history of ischemic heart disease and other diseases of the circulatory system: Secondary | ICD-10-CM

## 2023-01-11 DIAGNOSIS — N02B1 Recurrent and persistent immunoglobulin A nephropathy with glomerular lesion: Secondary | ICD-10-CM | POA: Diagnosis present

## 2023-01-11 DIAGNOSIS — S30811A Abrasion of abdominal wall, initial encounter: Secondary | ICD-10-CM | POA: Diagnosis present

## 2023-01-11 DIAGNOSIS — Z803 Family history of malignant neoplasm of breast: Secondary | ICD-10-CM | POA: Diagnosis not present

## 2023-01-11 DIAGNOSIS — Z800 Family history of malignant neoplasm of digestive organs: Secondary | ICD-10-CM | POA: Diagnosis not present

## 2023-01-11 DIAGNOSIS — S5011XA Contusion of right forearm, initial encounter: Secondary | ICD-10-CM | POA: Diagnosis present

## 2023-01-11 DIAGNOSIS — Z23 Encounter for immunization: Secondary | ICD-10-CM

## 2023-01-11 DIAGNOSIS — Z87891 Personal history of nicotine dependence: Secondary | ICD-10-CM

## 2023-01-11 DIAGNOSIS — S82401A Unspecified fracture of shaft of right fibula, initial encounter for closed fracture: Secondary | ICD-10-CM | POA: Diagnosis present

## 2023-01-11 DIAGNOSIS — S82201B Unspecified fracture of shaft of right tibia, initial encounter for open fracture type I or II: Secondary | ICD-10-CM | POA: Diagnosis not present

## 2023-01-11 DIAGNOSIS — M5481 Occipital neuralgia: Secondary | ICD-10-CM

## 2023-01-11 DIAGNOSIS — S82141B Displaced bicondylar fracture of right tibia, initial encounter for open fracture type I or II: Principal | ICD-10-CM | POA: Insufficient documentation

## 2023-01-11 HISTORY — PX: ORIF TIBIA PLATEAU: SHX2132

## 2023-01-11 LAB — COMPREHENSIVE METABOLIC PANEL
ALT: 38 U/L (ref 0–44)
AST: 34 U/L (ref 15–41)
Albumin: 4 g/dL (ref 3.5–5.0)
Alkaline Phosphatase: 62 U/L (ref 38–126)
Anion gap: 14 (ref 5–15)
BUN: 11 mg/dL (ref 6–20)
CO2: 24 mmol/L (ref 22–32)
Calcium: 9.7 mg/dL (ref 8.9–10.3)
Chloride: 101 mmol/L (ref 98–111)
Creatinine, Ser: 1.1 mg/dL (ref 0.61–1.24)
GFR, Estimated: >60 mL/min (ref >60)
Glucose, Bld: 115 mg/dL — ABNORMAL HIGH (ref 70–99)
Potassium: 3.3 mmol/L — ABNORMAL LOW (ref 3.5–5.1)
Sodium: 139 mmol/L (ref 135–145)
Total Bilirubin: 1 mg/dL (ref 0.3–1.2)
Total Protein: 7.4 g/dL (ref 6.5–8.1)

## 2023-01-11 LAB — CBC
HCT: 44.5 % (ref 39.0–52.0)
Hemoglobin: 14.8 g/dL (ref 13.0–17.0)
MCH: 28.1 pg (ref 26.0–34.0)
MCHC: 33.3 g/dL (ref 30.0–36.0)
MCV: 84.4 fL (ref 80.0–100.0)
Platelets: 301 10*3/uL (ref 150–400)
RBC: 5.27 MIL/uL (ref 4.22–5.81)
RDW: 12.6 % (ref 11.5–15.5)
WBC: 8 10*3/uL (ref 4.0–10.5)
nRBC: 0 % (ref 0.0–0.2)

## 2023-01-11 LAB — SAMPLE TO BLOOD BANK

## 2023-01-11 LAB — I-STAT CHEM 8, ED
BUN: 12 mg/dL (ref 6–20)
Calcium, Ion: 1.25 mmol/L (ref 1.15–1.40)
Chloride: 104 mmol/L (ref 98–111)
Creatinine, Ser: 0.9 mg/dL (ref 0.61–1.24)
Glucose, Bld: 112 mg/dL — ABNORMAL HIGH (ref 70–99)
HCT: 44 % (ref 39.0–52.0)
Hemoglobin: 15 g/dL (ref 13.0–17.0)
Potassium: 3.7 mmol/L (ref 3.5–5.1)
Sodium: 140 mmol/L (ref 135–145)
TCO2: 25 mmol/L (ref 22–32)

## 2023-01-11 LAB — ETHANOL: Alcohol, Ethyl (B): <10 mg/dL (ref <10)

## 2023-01-11 LAB — HIV ANTIBODY (ROUTINE TESTING W REFLEX): HIV Screen 4th Generation wRfx: NONREACTIVE

## 2023-01-11 LAB — PROTIME-INR
INR: 1 (ref 0.8–1.2)
Prothrombin Time: 13.6 seconds (ref 11.4–15.2)

## 2023-01-11 LAB — LACTIC ACID, PLASMA: Lactic Acid, Venous: 2 mmol/L (ref 0.5–1.9)

## 2023-01-11 SURGERY — OPEN REDUCTION INTERNAL FIXATION (ORIF) TIBIAL PLATEAU
Anesthesia: General | Laterality: Right

## 2023-01-11 MED ORDER — ONDANSETRON HCL 4 MG/2ML IJ SOLN
INTRAMUSCULAR | Status: AC
  Filled 2023-01-11: qty 2

## 2023-01-11 MED ORDER — IOHEXOL 350 MG/ML SOLN
75.0000 mL | Freq: Once | INTRAVENOUS | Status: AC | PRN
  Administered 2023-01-11: 75 mL via INTRAVENOUS

## 2023-01-11 MED ORDER — SUGAMMADEX SODIUM 200 MG/2ML IV SOLN
INTRAVENOUS | Status: DC | PRN
  Administered 2023-01-11: 200 mg via INTRAVENOUS

## 2023-01-11 MED ORDER — OXYCODONE HCL 5 MG/5ML PO SOLN: 5.0000 mg | Freq: Once | ORAL | Status: DC | PRN

## 2023-01-11 MED ORDER — OXYCODONE HCL 5 MG PO TABS
5.0000 mg | ORAL_TABLET | ORAL | Status: DC | PRN
  Administered 2023-01-11 – 2023-01-12 (×3): 15 mg via ORAL
  Filled 2023-01-11 (×3): qty 3

## 2023-01-11 MED ORDER — TETANUS-DIPHTH-ACELL PERTUSSIS 5-2.5-18.5 LF-MCG/0.5 IM SUSY
0.5000 mL | PREFILLED_SYRINGE | Freq: Once | INTRAMUSCULAR | Status: AC
  Administered 2023-01-11: 0.5 mL via INTRAMUSCULAR
  Filled 2023-01-11: qty 0.5

## 2023-01-11 MED ORDER — OXYCODONE HCL 5 MG PO TABS: 5.0000 mg | ORAL_TABLET | Freq: Once | ORAL | Status: DC | PRN

## 2023-01-11 MED ORDER — POLYETHYLENE GLYCOL 3350 17 G PO PACK: 17.0000 g | PACK | Freq: Every day | ORAL | Status: DC | PRN

## 2023-01-11 MED ORDER — ONDANSETRON HCL 4 MG/2ML IJ SOLN: 4.0000 mg | Freq: Four times a day (QID) | INTRAMUSCULAR | Status: DC | PRN

## 2023-01-11 MED ORDER — VANCOMYCIN HCL 1000 MG IV SOLR
INTRAVENOUS | Status: DC | PRN
  Administered 2023-01-11: 1000 mg

## 2023-01-11 MED ORDER — MIDAZOLAM HCL 2 MG/2ML IJ SOLN
INTRAMUSCULAR | Status: AC
  Filled 2023-01-11: qty 2

## 2023-01-11 MED ORDER — SODIUM CHLORIDE 0.9 % IV SOLN: INTRAVENOUS | Status: DC

## 2023-01-11 MED ORDER — ONDANSETRON HCL 4 MG PO TABS: 4.0000 mg | ORAL_TABLET | Freq: Four times a day (QID) | ORAL | Status: DC | PRN

## 2023-01-11 MED ORDER — ROCURONIUM BROMIDE 10 MG/ML (PF) SYRINGE
PREFILLED_SYRINGE | INTRAVENOUS | Status: DC | PRN
  Administered 2023-01-11: 60 mg via INTRAVENOUS

## 2023-01-11 MED ORDER — KETOROLAC TROMETHAMINE 15 MG/ML IJ SOLN
15.0000 mg | Freq: Four times a day (QID) | INTRAMUSCULAR | Status: AC
  Administered 2023-01-11 – 2023-01-12 (×4): 15 mg via INTRAVENOUS
  Filled 2023-01-11 (×4): qty 1

## 2023-01-11 MED ORDER — METHOCARBAMOL 1000 MG/10ML IJ SOLN: 500.0000 mg | Freq: Four times a day (QID) | INTRAVENOUS | Status: DC | PRN

## 2023-01-11 MED ORDER — HYDROMORPHONE HCL 1 MG/ML IJ SOLN
INTRAMUSCULAR | Status: AC
  Filled 2023-01-11: qty 1

## 2023-01-11 MED ORDER — CEFAZOLIN SODIUM-DEXTROSE 2-4 GM/100ML-% IV SOLN
2.0000 g | Freq: Three times a day (TID) | INTRAVENOUS | Status: DC
  Administered 2023-01-11 – 2023-01-12 (×2): 2 g via INTRAVENOUS
  Filled 2023-01-11 (×2): qty 100

## 2023-01-11 MED ORDER — MORPHINE SULFATE (PF) 2 MG/ML IV SOLN
1.0000 mg | INTRAVENOUS | Status: DC | PRN
  Administered 2023-01-11 (×2): 2 mg via INTRAVENOUS
  Filled 2023-01-11 (×2): qty 1

## 2023-01-11 MED ORDER — PIPERACILLIN-TAZOBACTAM 3.375 G IVPB 30 MIN
3.3750 g | Freq: Once | INTRAVENOUS | Status: AC
  Administered 2023-01-11: 3.375 g via INTRAVENOUS
  Filled 2023-01-11: qty 50

## 2023-01-11 MED ORDER — MORPHINE SULFATE (PF) 2 MG/ML IV SOLN
2.0000 mg | INTRAVENOUS | Status: DC | PRN
  Administered 2023-01-11: 2 mg via INTRAVENOUS
  Filled 2023-01-11: qty 1

## 2023-01-11 MED ORDER — ROCURONIUM BROMIDE 10 MG/ML (PF) SYRINGE
PREFILLED_SYRINGE | INTRAVENOUS | Status: AC
  Filled 2023-01-11: qty 10

## 2023-01-11 MED ORDER — DOCUSATE SODIUM 100 MG PO CAPS
100.0000 mg | ORAL_CAPSULE | Freq: Two times a day (BID) | ORAL | Status: DC
  Administered 2023-01-11: 100 mg via ORAL
  Filled 2023-01-11 (×2): qty 1

## 2023-01-11 MED ORDER — ACETAMINOPHEN 500 MG PO TABS
1000.0000 mg | ORAL_TABLET | Freq: Once | ORAL | Status: AC
  Administered 2023-01-11: 1000 mg via ORAL
  Filled 2023-01-11: qty 2

## 2023-01-11 MED ORDER — CHLORHEXIDINE GLUCONATE 0.12 % MT SOLN
OROMUCOSAL | Status: AC
  Administered 2023-01-11: 15 mL
  Filled 2023-01-11: qty 15

## 2023-01-11 MED ORDER — ONDANSETRON HCL 4 MG/2ML IJ SOLN
4.0000 mg | Freq: Once | INTRAMUSCULAR | Status: AC
  Administered 2023-01-11: 4 mg via INTRAVENOUS
  Filled 2023-01-11: qty 2

## 2023-01-11 MED ORDER — AMISULPRIDE (ANTIEMETIC) 5 MG/2ML IV SOLN: 10.0000 mg | Freq: Once | INTRAVENOUS | Status: DC | PRN

## 2023-01-11 MED ORDER — CEFAZOLIN SODIUM-DEXTROSE 2-4 GM/100ML-% IV SOLN
2.0000 g | INTRAVENOUS | Status: AC
  Administered 2023-01-11: 2 g via INTRAVENOUS
  Filled 2023-01-11: qty 100

## 2023-01-11 MED ORDER — 0.9 % SODIUM CHLORIDE (POUR BTL) OPTIME
TOPICAL | Status: DC | PRN
  Administered 2023-01-11: 1000 mL

## 2023-01-11 MED ORDER — MIDAZOLAM HCL 2 MG/2ML IJ SOLN
INTRAMUSCULAR | Status: DC | PRN
  Administered 2023-01-11: 2 mg via INTRAVENOUS

## 2023-01-11 MED ORDER — DEXAMETHASONE SODIUM PHOSPHATE 10 MG/ML IJ SOLN
INTRAMUSCULAR | Status: AC
  Filled 2023-01-11: qty 1

## 2023-01-11 MED ORDER — PROPOFOL 10 MG/ML IV BOLUS
INTRAVENOUS | Status: DC | PRN
  Administered 2023-01-11: 200 mg via INTRAVENOUS

## 2023-01-11 MED ORDER — VANCOMYCIN HCL 1000 MG IV SOLR
INTRAVENOUS | Status: AC
  Filled 2023-01-11: qty 20

## 2023-01-11 MED ORDER — ONDANSETRON HCL 4 MG/2ML IJ SOLN
INTRAMUSCULAR | Status: DC | PRN
  Administered 2023-01-11: 4 mg via INTRAVENOUS

## 2023-01-11 MED ORDER — LIDOCAINE 2% (20 MG/ML) 5 ML SYRINGE
INTRAMUSCULAR | Status: DC | PRN
  Administered 2023-01-11: 60 mg via INTRAVENOUS

## 2023-01-11 MED ORDER — PROPOFOL 10 MG/ML IV BOLUS
INTRAVENOUS | Status: AC
  Filled 2023-01-11: qty 20

## 2023-01-11 MED ORDER — LACTATED RINGERS IV SOLN: INTRAVENOUS | Status: DC | PRN

## 2023-01-11 MED ORDER — TRANEXAMIC ACID-NACL 1000-0.7 MG/100ML-% IV SOLN
1000.0000 mg | INTRAVENOUS | Status: AC
  Administered 2023-01-11: 1000 mg via INTRAVENOUS
  Filled 2023-01-11: qty 100

## 2023-01-11 MED ORDER — SODIUM CHLORIDE 0.9 % IV BOLUS
1000.0000 mL | Freq: Once | INTRAVENOUS | Status: AC
  Administered 2023-01-11: 1000 mL via INTRAVENOUS

## 2023-01-11 MED ORDER — FENTANYL CITRATE (PF) 250 MCG/5ML IJ SOLN
INTRAMUSCULAR | Status: AC
  Filled 2023-01-11: qty 5

## 2023-01-11 MED ORDER — ACETAMINOPHEN 500 MG PO TABS
1000.0000 mg | ORAL_TABLET | Freq: Four times a day (QID) | ORAL | Status: DC
  Administered 2023-01-11 – 2023-01-12 (×4): 1000 mg via ORAL
  Filled 2023-01-11 (×4): qty 2

## 2023-01-11 MED ORDER — METOCLOPRAMIDE HCL 5 MG PO TABS: 5.0000 mg | ORAL_TABLET | Freq: Three times a day (TID) | ORAL | Status: DC | PRN

## 2023-01-11 MED ORDER — METOCLOPRAMIDE HCL 5 MG/ML IJ SOLN: 5.0000 mg | Freq: Three times a day (TID) | INTRAMUSCULAR | Status: DC | PRN

## 2023-01-11 MED ORDER — ENOXAPARIN SODIUM 40 MG/0.4ML IJ SOSY
40.0000 mg | PREFILLED_SYRINGE | INTRAMUSCULAR | Status: DC
  Administered 2023-01-12: 40 mg via SUBCUTANEOUS
  Filled 2023-01-11: qty 0.4

## 2023-01-11 MED ORDER — MORPHINE SULFATE (PF) 4 MG/ML IV SOLN
4.0000 mg | Freq: Once | INTRAVENOUS | Status: AC
  Administered 2023-01-11: 4 mg via INTRAVENOUS
  Filled 2023-01-11: qty 1

## 2023-01-11 MED ORDER — FENTANYL CITRATE (PF) 250 MCG/5ML IJ SOLN
INTRAMUSCULAR | Status: DC | PRN
  Administered 2023-01-11: 100 ug via INTRAVENOUS
  Administered 2023-01-11: 50 ug via INTRAVENOUS
  Administered 2023-01-11: 100 ug via INTRAVENOUS

## 2023-01-11 MED ORDER — SODIUM CHLORIDE 0.9 % IR SOLN
Status: DC | PRN
  Administered 2023-01-11: 3000 mL

## 2023-01-11 MED ORDER — METHOCARBAMOL 500 MG PO TABS
500.0000 mg | ORAL_TABLET | Freq: Four times a day (QID) | ORAL | Status: DC | PRN
  Administered 2023-01-11: 500 mg via ORAL
  Filled 2023-01-11: qty 1

## 2023-01-11 MED ORDER — CHLORHEXIDINE GLUCONATE 4 % EX SOLN: 60.0000 mL | Freq: Once | CUTANEOUS | Status: DC

## 2023-01-11 MED ORDER — DEXMEDETOMIDINE HCL IN NACL 80 MCG/20ML IV SOLN
INTRAVENOUS | Status: AC
  Filled 2023-01-11: qty 20

## 2023-01-11 MED ORDER — HYDROMORPHONE HCL 1 MG/ML IJ SOLN
0.2500 mg | INTRAMUSCULAR | Status: DC | PRN
  Administered 2023-01-11 (×4): 0.5 mg via INTRAVENOUS

## 2023-01-11 MED ORDER — POVIDONE-IODINE 10 % EX SWAB: 2.0000 | Freq: Once | CUTANEOUS | Status: DC

## 2023-01-11 MED ORDER — DEXAMETHASONE SODIUM PHOSPHATE 10 MG/ML IJ SOLN
INTRAMUSCULAR | Status: DC | PRN
  Administered 2023-01-11: 10 mg via INTRAVENOUS

## 2023-01-11 MED ORDER — LIDOCAINE 2% (20 MG/ML) 5 ML SYRINGE
INTRAMUSCULAR | Status: AC
  Filled 2023-01-11: qty 5

## 2023-01-11 MED ORDER — DEXMEDETOMIDINE HCL IN NACL 80 MCG/20ML IV SOLN
INTRAVENOUS | Status: DC | PRN
  Administered 2023-01-11: 10 ug via INTRAVENOUS

## 2023-01-11 SURGICAL SUPPLY — 87 items
APL PRP STRL LF DISP 70% ISPRP (MISCELLANEOUS) ×2
BAG COUNTER SPONGE SURGICOUNT (BAG) ×1 IMPLANT
BAG SPNG CNTER NS LX DISP (BAG) ×1
BANDAGE ESMARK 6X9 LF (GAUZE/BANDAGES/DRESSINGS) ×1 IMPLANT
BIT DRILL CALIBR QC 2.8X250 (BIT) IMPLANT
BIT DRILL QC SFS 2.5X170 (BIT) IMPLANT
BLADE CLIPPER SURG (BLADE) IMPLANT
BLADE SURG 15 STRL LF DISP TIS (BLADE) ×1 IMPLANT
BLADE SURG 15 STRL SS (BLADE) ×1
BNDG CMPR 5X4 KNIT ELC UNQ LF (GAUZE/BANDAGES/DRESSINGS) ×1
BNDG CMPR 5X62 HK CLSR LF (GAUZE/BANDAGES/DRESSINGS) ×1
BNDG CMPR 6 X 5 YARDS HK CLSR (GAUZE/BANDAGES/DRESSINGS) ×1
BNDG CMPR 9X6 STRL LF SNTH (GAUZE/BANDAGES/DRESSINGS)
BNDG ELASTIC 4INX 5YD STR LF (GAUZE/BANDAGES/DRESSINGS) IMPLANT
BNDG ELASTIC 4X5.8 VLCR STR LF (GAUZE/BANDAGES/DRESSINGS) ×1 IMPLANT
BNDG ELASTIC 6INX 5YD STR LF (GAUZE/BANDAGES/DRESSINGS) IMPLANT
BNDG ELASTIC 6X5.8 VLCR STR LF (GAUZE/BANDAGES/DRESSINGS) ×1 IMPLANT
BNDG ESMARK 6X9 LF (GAUZE/BANDAGES/DRESSINGS)
BNDG GAUZE DERMACEA FLUFF 4 (GAUZE/BANDAGES/DRESSINGS) ×1 IMPLANT
BNDG GZE DERMACEA 4 6PLY (GAUZE/BANDAGES/DRESSINGS)
BRUSH SCRUB EZ PLAIN DRY (MISCELLANEOUS) ×2 IMPLANT
CANISTER SUCT 3000ML PPV (MISCELLANEOUS) ×1 IMPLANT
CHLORAPREP W/TINT 26 (MISCELLANEOUS) ×2 IMPLANT
COVER SURGICAL LIGHT HANDLE (MISCELLANEOUS) ×1 IMPLANT
CUFF TOURN SGL QUICK 34 (TOURNIQUET CUFF) ×1
CUFF TRNQT CYL 34X4.125X (TOURNIQUET CUFF) ×1 IMPLANT
DRAPE C-ARM 42X72 X-RAY (DRAPES) ×1 IMPLANT
DRAPE C-ARMOR (DRAPES) ×1 IMPLANT
DRAPE ORTHO SPLIT 77X108 STRL (DRAPES) ×2
DRAPE SURG ORHT 6 SPLT 77X108 (DRAPES) ×2 IMPLANT
DRAPE U-SHAPE 47X51 STRL (DRAPES) ×1 IMPLANT
ELECT REM PT RETURN 9FT ADLT (ELECTROSURGICAL) ×1
ELECTRODE REM PT RTRN 9FT ADLT (ELECTROSURGICAL) ×1 IMPLANT
GAUZE PAD ABD 8X10 STRL (GAUZE/BANDAGES/DRESSINGS) ×2 IMPLANT
GAUZE SPONGE 4X4 12PLY STRL (GAUZE/BANDAGES/DRESSINGS) ×1 IMPLANT
GLOVE BIO SURGEON STRL SZ 6.5 (GLOVE) ×3 IMPLANT
GLOVE BIO SURGEON STRL SZ7.5 (GLOVE) ×4 IMPLANT
GLOVE BIOGEL PI IND STRL 6.5 (GLOVE) ×1 IMPLANT
GLOVE BIOGEL PI IND STRL 7.5 (GLOVE) ×1 IMPLANT
GOWN STRL REUS W/ TWL LRG LVL3 (GOWN DISPOSABLE) ×2 IMPLANT
GOWN STRL REUS W/TWL LRG LVL3 (GOWN DISPOSABLE) ×2
HANDPIECE INTERPULSE COAX TIP (DISPOSABLE) ×1
IMMOBILIZER KNEE 22 UNIV (SOFTGOODS) ×1 IMPLANT
K-WIRE 1.6X150 (WIRE) ×1
KIT BASIN OR (CUSTOM PROCEDURE TRAY) ×1 IMPLANT
KIT TURNOVER KIT B (KITS) ×1 IMPLANT
KWIRE 1.6X150 (WIRE) IMPLANT
NDL SUT 6 .5 CRC .975X.05 MAYO (NEEDLE) ×1 IMPLANT
NEEDLE MAYO TAPER (NEEDLE) ×1
NS IRRIG 1000ML POUR BTL (IV SOLUTION) ×1 IMPLANT
PACK TOTAL JOINT (CUSTOM PROCEDURE TRAY) ×1 IMPLANT
PAD ARMBOARD 7.5X6 YLW CONV (MISCELLANEOUS) ×2 IMPLANT
PAD CAST 4YDX4 CTTN HI CHSV (CAST SUPPLIES) ×1 IMPLANT
PADDING CAST COTTON 4X4 STRL (CAST SUPPLIES) ×1
PADDING CAST COTTON 6X4 STRL (CAST SUPPLIES) ×1 IMPLANT
PLATE LOCK TIB VA-LCP 3.5X147 (Plate) IMPLANT
SCREW CORT 3.5X44M SELF TAP (Screw) IMPLANT
SCREW CORTEX 3.5 70MM SELF TAP (Screw) IMPLANT
SCREW LOCK 3.5X85 (Screw) IMPLANT
SCREW LOCK CORT ST 3.5X30 (Screw) IMPLANT
SCREW LOCK CORT ST 3.5X36 (Screw) IMPLANT
SCREW LOCK CORT ST 3.5X40 (Screw) IMPLANT
SCREW LOCKING 3.5X70MM VA (Screw) IMPLANT
SCREW LOCKING 3.5X80MM VA (Screw) IMPLANT
SCREW LOCKING VA 3.5X85MM (Screw) IMPLANT
SET HNDPC FAN SPRY TIP SCT (DISPOSABLE) IMPLANT
STAPLER VISISTAT 35W (STAPLE) ×1 IMPLANT
SUCTION FRAZIER HANDLE 10FR (MISCELLANEOUS) ×1
SUCTION TUBE FRAZIER 10FR DISP (MISCELLANEOUS) ×1 IMPLANT
SUT ETHILON 2 0 FS 18 (SUTURE) ×1 IMPLANT
SUT ETHILON 3 0 PS 1 (SUTURE) IMPLANT
SUT FIBERWIRE #2 38 T-5 BLUE (SUTURE)
SUT MNCRL AB 3-0 PS2 27 (SUTURE) IMPLANT
SUT MON AB 2-0 CT1 36 (SUTURE) IMPLANT
SUT PDS AB 0 CT1 27 (SUTURE) IMPLANT
SUT VIC AB 0 CT1 27 (SUTURE)
SUT VIC AB 0 CT1 27XBRD ANBCTR (SUTURE) IMPLANT
SUT VIC AB 1 CT1 18XCR BRD 8 (SUTURE) IMPLANT
SUT VIC AB 1 CT1 27 (SUTURE) ×1
SUT VIC AB 1 CT1 27XBRD ANBCTR (SUTURE) ×1 IMPLANT
SUT VIC AB 1 CT1 8-18 (SUTURE) ×1
SUT VIC AB 2-0 CT1 27 (SUTURE) ×1
SUT VIC AB 2-0 CT1 TAPERPNT 27 (SUTURE) ×2 IMPLANT
SUTURE FIBERWR #2 38 T-5 BLUE (SUTURE) IMPLANT
TOWEL GREEN STERILE (TOWEL DISPOSABLE) ×2 IMPLANT
TRAY FOLEY MTR SLVR 16FR STAT (SET/KITS/TRAYS/PACK) IMPLANT
WATER STERILE IRR 1000ML POUR (IV SOLUTION) ×2 IMPLANT
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
## 2023-01-11 NOTE — Transfer of Care (Signed)
Immediate Anesthesia Transfer of Care Note  Patient: Louis Miller  Procedure(s) Performed: IRRIGATION AND DEBRIDEMENT, OPEN REDUCTION INTERNAL FIXATION (ORIF) TIBIAL PLATEAU (Right)  Patient Location: PACU  Anesthesia Type:General  Level of Consciousness: drowsy and patient cooperative  Airway & Oxygen Therapy: Patient Spontanous Breathing  Post-op Assessment: Report given to RN and Post -op Vital signs reviewed and stable  Post vital signs: Reviewed and stable  Last Vitals:  Vitals Value Taken Time  BP 119/59 01/11/23 1619  Temp 37.1 C 01/11/23 1619  Pulse 67 01/11/23 1622  Resp 15 01/11/23 1622  SpO2 98 % 01/11/23 1622  Vitals shown include unvalidated device data.  Last Pain:  Vitals:   01/11/23 1339  TempSrc:   PainSc: 8       Patients Stated Pain Goal: 0 (01/11/23 1339)  Complications: No notable events documented.
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
## 2023-01-11 NOTE — Progress Notes (Signed)
Orthopedic Tech Progress Note Patient Details:  Louis Miller Dec 04, 1995 [id number]  Ortho Devices Type of Ortho Device: Knee Immobilizer Ortho Device/Splint Location: RLE Ortho Device/Splint Interventions: Application, Ordered   Post Interventions Patient Tolerated: Well  Mavryk Pino A Brigett Estell 01/11/2023, 2:50 PM
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
## 2023-01-11 NOTE — Anesthesia Procedure Notes (Signed)
Procedure Name: Intubation Date/Time: 01/11/2023 2:50 PM  Performed by: Gus Puma, CRNAPre-anesthesia Checklist: Patient identified, Emergency Drugs available, Suction available and Patient being monitored Patient Re-evaluated:Patient Re-evaluated prior to induction Oxygen Delivery Method: Circle System Utilized Preoxygenation: Pre-oxygenation with 100% oxygen Induction Type: IV induction Ventilation: Mask ventilation without difficulty Laryngoscope Size: Mac and 4 Grade View: Grade I Tube type: Oral Tube size: 7.5 mm Number of attempts: 1 Airway Equipment and Method: Stylet Placement Confirmation: ETT inserted through vocal cords under direct vision, positive ETCO2 and breath sounds checked- equal and bilateral Secured at: 23 cm Tube secured with: Tape Dental Injury: Teeth and Oropharynx as per pre-operative assessment
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
## 2023-01-11 NOTE — ED Notes (Signed)
Called report [phone]

## 2023-01-11 NOTE — Interval H&P Note (Signed)
History and Physical Interval Note:  01/11/2023 2:05 PM  Louis Miller  has presented today for surgery, with the diagnosis of open fracture of right tibial plateau.  The various methods of treatment have been discussed with the patient and family. After consideration of risks, benefits and other options for treatment, the patient has consented to  Procedure(s): IRRIGATION AND DEBRIDEMENT EXTERNAL FIXATION VERSUS OPEN REDUCTION INTERNAL FIXATION (ORIF) TIBIAL PLATEAU (Right) as a surgical intervention.  The patient's history has been reviewed, patient examined, no change in status, stable for surgery.  I have reviewed the patient's chart and labs.  Questions were answered to the patient's satisfaction.     Caryn Bee P Oran Dillenburg
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
## 2023-01-11 NOTE — ED Notes (Signed)
Positioned pt's R leg on pillows to try and relieve some of his pain. Only helped a small amt. Pt received IV morphine with no other relief. MD made aware.
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
## 2023-01-11 NOTE — Anesthesia Preprocedure Evaluation (Addendum)
Anesthesia Evaluation  Patient identified by MRN, date of birth, ID band Patient awake    Reviewed: Allergy & Precautions, NPO status , Patient's Chart, lab work & pertinent test results  History of Anesthesia Complications Negative for: history of anesthetic complications  Airway Mallampati: III  TM Distance: >3 FB Neck ROM: Full   Comment: Previous grade I view with MAC 3, easy mask Dental  (+) Dental Advisory Given   Pulmonary neg shortness of breath, neg sleep apnea, neg COPD, neg recent URI, former smoker vapes   Pulmonary exam normal breath sounds clear to auscultation       Cardiovascular negative cardio ROS  Rhythm:Regular Rate:Normal     Neuro/Psych  Headaches, neg Seizures  Neuromuscular disease (occipital neuralgia)    GI/Hepatic negative GI ROS, Neg liver ROS,,,  Endo/Other  negative endocrine ROS    Renal/GU Renal disease (IgA nephropathy, in remission)     Musculoskeletal   Abdominal   Peds  Hematology negative hematology ROS (+)   Anesthesia Other Findings Reports he drinks liquor out of pint jars on the weekend. Unable to quantify how much.  Reproductive/Obstetrics                             Anesthesia Physical Anesthesia Plan  ASA: 2  Anesthesia Plan: General   Post-op Pain Management: Tylenol PO (pre-op)*   Induction: Intravenous  PONV Risk Score and Plan: 2 and Ondansetron, Dexamethasone and Treatment may vary due to age or medical condition  Airway Management Planned: Oral ETT  Additional Equipment:   Intra-op Plan:   Post-operative Plan: Extubation in OR  Informed Consent: I have reviewed the patients History and Physical, chart, labs and discussed the procedure including the risks, benefits and alternatives for the proposed anesthesia with the patient or authorized representative who has indicated his/her understanding and acceptance.     Dental  advisory given  Plan Discussed with: CRNA and Anesthesiologist  Anesthesia Plan Comments: (Risks of general anesthesia discussed including, but not limited to, sore throat, hoarse voice, chipped/damaged teeth, injury to vocal cords, nausea and vomiting, allergic reactions, lung infection, heart attack, stroke, and death. All questions answered. )        Anesthesia Quick Evaluation

## 2023-01-12 ENCOUNTER — Other Ambulatory Visit (HOSPITAL_COMMUNITY): Payer: Self-pay

## 2023-01-12 LAB — BASIC METABOLIC PANEL
Anion gap: 8 (ref 5–15)
BUN: 7 mg/dL (ref 6–20)
CO2: 28 mmol/L (ref 22–32)
Calcium: 8.7 mg/dL — ABNORMAL LOW (ref 8.9–10.3)
Chloride: 98 mmol/L (ref 98–111)
Creatinine, Ser: 1.21 mg/dL (ref 0.61–1.24)
GFR, Estimated: >60 mL/min (ref >60)
Glucose, Bld: 163 mg/dL — ABNORMAL HIGH (ref 70–99)
Potassium: 4.3 mmol/L (ref 3.5–5.1)
Sodium: 134 mmol/L — ABNORMAL LOW (ref 135–145)

## 2023-01-12 LAB — CBC
HCT: 36 % — ABNORMAL LOW (ref 39.0–52.0)
Hemoglobin: 11.8 g/dL — ABNORMAL LOW (ref 13.0–17.0)
MCH: 28.3 pg (ref 26.0–34.0)
MCHC: 32.8 g/dL (ref 30.0–36.0)
MCV: 86.3 fL (ref 80.0–100.0)
Platelets: 267 10*3/uL (ref 150–400)
RBC: 4.17 MIL/uL — ABNORMAL LOW (ref 4.22–5.81)
RDW: 12.6 % (ref 11.5–15.5)
WBC: 10.1 10*3/uL (ref 4.0–10.5)
nRBC: 0 % (ref 0.0–0.2)

## 2023-01-12 LAB — VITAMIN D 25 HYDROXY (VIT D DEFICIENCY, FRACTURES): Vit D, 25-Hydroxy: 22.49 ng/mL — ABNORMAL LOW (ref 30–100)

## 2023-01-12 MED ORDER — ZOLPIDEM TARTRATE 5 MG PO TABS
5.0000 mg | ORAL_TABLET | Freq: Every evening | ORAL | Status: DC | PRN
  Administered 2023-01-12: 5 mg via ORAL
  Filled 2023-01-12: qty 1

## 2023-01-12 MED ORDER — ASPIRIN 81 MG PO TBEC
81.0000 mg | DELAYED_RELEASE_TABLET | Freq: Every day | ORAL | 0 refills | Status: DC
Start: 2023-01-12 — End: 2023-01-12
  Filled 2023-01-12: qty 30, 30d supply, fill #0

## 2023-01-12 MED ORDER — METHOCARBAMOL 500 MG PO TABS
500.0000 mg | ORAL_TABLET | Freq: Four times a day (QID) | ORAL | 0 refills | Status: DC | PRN
  Filled 2023-01-12: qty 28, 7d supply, fill #0

## 2023-01-12 MED ORDER — CELECOXIB 200 MG PO CAPS
200.0000 mg | ORAL_CAPSULE | Freq: Two times a day (BID) | ORAL | 0 refills | Status: DC
  Filled 2023-01-12: qty 14, 7d supply, fill #0

## 2023-01-12 MED ORDER — ACETAMINOPHEN 500 MG PO TABS: 1000.0000 mg | ORAL_TABLET | Freq: Four times a day (QID) | ORAL | 0 refills | Status: AC | PRN

## 2023-01-12 MED ORDER — CELECOXIB 200 MG PO CAPS: 200.0000 mg | ORAL_CAPSULE | Freq: Two times a day (BID) | ORAL | 0 refills | Status: AC

## 2023-01-12 MED ORDER — OXYCODONE HCL 10 MG PO TABS: 10.0000 mg | ORAL_TABLET | ORAL | 0 refills | Status: AC | PRN

## 2023-01-12 MED ORDER — OXYCODONE HCL 10 MG PO TABS
10.0000 mg | ORAL_TABLET | ORAL | 0 refills | Status: DC | PRN
  Filled 2023-01-12: qty 42, 7d supply, fill #0

## 2023-01-12 MED ORDER — METHOCARBAMOL 500 MG PO TABS: 500.0000 mg | ORAL_TABLET | Freq: Four times a day (QID) | ORAL | 0 refills | Status: AC | PRN

## 2023-01-12 MED ORDER — ASPIRIN 81 MG PO TBEC
81.0000 mg | DELAYED_RELEASE_TABLET | Freq: Every day | ORAL | 0 refills | Status: AC
Start: 2023-01-12 — End: 2023-02-11

## 2023-01-12 NOTE — TOC Transition Note (Signed)
Transition of Care [facility]) - CM/SW Discharge Note   Patient Details  Name: SMARAN BEE MRN: [id number] Date of Birth: [birth year]  Transition of Care [facility]) CM/SW Contact:  Gordy Clement, RN Phone Number: 01/12/2023, 11:25 AM   Clinical Narrative:    Patient will DC to home with Family.  Outpatient PT has been recommended and referral has been made to [facility].  Crutches ordered (patient pay out of pocket) with [employer]- delivered and Shower chair will be purchased by patient on [employer].  Family to transport home.  No additional TOC needs           Patient Goals and CMS Choice      Discharge Placement                         Discharge Plan and Services Additional resources added to the After Visit Summary for                                       Social Determinants of Health (SDOH) Interventions SDOH Screenings   Tobacco Use: High Risk (01/11/2023)     Readmission Risk Interventions     No data to display

## 2023-01-12 NOTE — Evaluation (Signed)
Physical Therapy Evaluation Patient Details Name: Louis Miller MRN: [id number] DOB: Apr 29, 1996 Today's Date: 01/12/2023  History of Present Illness  27 y.o. male admitted 5/30 with open Rt tibial plateau fracture. On 5/30 S/p Open reduction internal fixation of right bicondylar tibial plateau fracture and Irrigation and debridement of right open tibial plateau fracture. PMH: occipital neuralgia, kidney disease,  Allergic rhinitis.  Clinical Impression  Patient is s/p above surgery resulting in functional limitations due to the deficits listed below (see PT Problem List). Significant other present during session, very helpful and available to assist as needed at d/c. 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 Currently able to ambulate safely with crutches 100' while maintaining NWB on RLE. Navigates steps similar to home set up with min assist (provided by wife during training today.) Educated on safety, ice, elevation, and extension to maximize healing outcomes. Adequate for d/c when medically ready. All questions answered. Will follow and progress until d/c.        Recommendations for follow up therapy are one component of a multi-disciplinary discharge planning process, led by the attending physician.  Recommendations may be updated based on patient status, additional functional criteria and insurance authorization.     Assistance Recommended at Discharge Set up Supervision/Assistance  Patient can return home with the following  Assistance with cooking/housework;Assist for transportation;Help with stairs or ramp for entrance    Equipment Recommendations Crutches (6'0")     Functional Status Assessment Patient has had a recent decline in their functional status and demonstrates the ability to make significant improvements in function in a reasonable and predictable amount of time.     Precautions / Restrictions Precautions Precautions: Fall Restrictions Weight Bearing  Restrictions: Yes RLE Weight Bearing: Non weight bearing      Mobility  Bed Mobility Overal bed mobility: Modified Independent             General bed mobility comments: extra time    Transfers Overall transfer level: Needs assistance Equipment used: Rolling walker (2 wheels), Crutches Transfers: Sit to/from Stand Sit to Stand: Supervision           General transfer comment: Educated on transfer techniques. Maintains NWB on RLE. Practiced with RW and crutches from multiple surfaces. No physical assist needed.    Ambulation/Gait Ambulation/Gait assistance: Supervision Gait Distance (Feet): 100 Feet Assistive device: Rolling walker (2 wheels), Crutches Gait Pattern/deviations: Step-to pattern Gait velocity: dec Gait velocity interpretation: <1.8 ft/sec, indicate of risk for recurrent falls   General Gait Details: Educated on safe AD use with RW and crutches. Pt preferance for crutches. Educated on safety and awareness. No LOB with either device. Maintains NWB on RLE.  Stairs Stairs: Yes Stairs assistance: Min assist Stair Management: No rails, One rail Right, Step to pattern, Forwards, Backwards, With walker, With crutches Number of Stairs: 5 General stair comments: Practiced with significant other present, navigating stairs similar to home set-up. Utilized crutches and walker. min assist to block RW and stabilize. Cues for sequencing. Declines further training, feels comfortable with crutches over RW.  Wheelchair Mobility    Modified Rankin (Stroke Patients Only)       Balance Overall balance assessment: Needs assistance Sitting-balance support: No upper extremity supported, Feet supported Sitting balance-Leahy Scale: Good     Standing balance support: Single extremity supported, During functional activity Standing balance-Leahy Scale: Poor  Pertinent Vitals/Pain      Home Living Family/patient expects to be  discharged to:: Private residence Living Arrangements: Spouse/significant other;Children Available Help at Discharge: Family;Available 24 hours/day Type of Home: Mobile home Home Access: Stairs to enter Entrance Stairs-Rails: Right;Left;Can reach both Entrance Stairs-Number of Steps: 5   Home Layout: One level Home Equipment: None      Prior Function Prior Level of Function : Independent/Modified Independent;Working/employed;Driving             Mobility Comments: in ADLs Comments: ind     Hand Dominance   Dominant Hand:  (ambi)    Extremity/Trunk Assessment   Upper Extremity Assessment Upper Extremity Assessment: Defer to OT evaluation    Lower Extremity Assessment Lower Extremity Assessment: RLE deficits/detail RLE Deficits / Details: Bandaged. Able to move ankle and toes volitionally.       Communication   Communication: No difficulties  Cognition Arousal/Alertness: Awake/alert Behavior During Therapy: WFL for tasks assessed/performed Overall Cognitive Status: Within Functional Limits for tasks assessed                                          General Comments General comments (skin integrity, edema, etc.): SO present, very supportive. Encouraged full extension Rt knee at rest, elevation.    Exercises Total Joint Exercises Ankle Circles/Pumps: AROM, Both, 15 reps, Supine Quad Sets: Strengthening, Both, 10 reps, Seated   Assessment/Plan    PT Assessment Patient needs continued PT services  PT Problem List Decreased strength;Decreased range of motion;Decreased activity tolerance;Decreased balance;Decreased mobility;Decreased knowledge of use of DME;Decreased knowledge of precautions;Pain       PT Treatment Interventions DME instruction;Gait training;Stair training;Functional mobility training;Therapeutic activities;Therapeutic exercise;Balance training;Neuromuscular re-education;Patient/family education;Modalities    PT Goals (Current  goals can be found in the Care Plan section)  Acute Rehab PT Goals Patient Stated Goal: get well, return to work PT Goal Formulation: With patient/family Time For Goal Achievement: 01/16/23 Potential to Achieve Goals: Good    Frequency Min 5X/week     Co-evaluation               AM-PAC PT "6 Clicks" Mobility  Outcome Measure Help needed turning from your back to your side while in a flat bed without using bedrails?: None Help needed moving from lying on your back to sitting on the side of a flat bed without using bedrails?: None Help needed moving to and from a bed to a chair (including a wheelchair)?: A Little Help needed standing up from a chair using your arms (e.g., wheelchair or bedside chair)?: A Little Help needed to walk in hospital room?: A Little Help needed climbing 3-5 steps with a railing? : A Little 6 Click Score: 20    End of Session Equipment Utilized During Treatment: Gait belt Activity Tolerance: Patient tolerated treatment well Patient left: in chair;with call bell/phone within reach;with chair alarm set;with family/visitor present   PT Visit Diagnosis: Unsteadiness on feet (R26.81);Other abnormalities of gait and mobility (R26.89);Difficulty in walking, not elsewhere classified (R26.2);Pain Pain - Right/Left: Right Pain - part of body: Leg    Time: 0910-1001 PT Time Calculation (min) (ACUTE ONLY): 51 min   Charges:   PT Evaluation $PT Eval Low Complexity: 1 Low PT Treatments $Gait Training: 8-22 mins $Therapeutic Activity: 8-22 mins        Kathlyn Sacramento, PT, DPT Physical Therapist Acute Rehabilitation Services [facility]  [phone]   Berton Mount 01/12/2023, 10:24 AM

## 2023-01-12 NOTE — Anesthesia Postprocedure Evaluation (Signed)
Anesthesia Post Note  Patient: Louis Miller  Procedure(s) Performed: IRRIGATION AND DEBRIDEMENT, OPEN REDUCTION INTERNAL FIXATION (ORIF) TIBIAL PLATEAU (Right)     Patient location during evaluation: PACU Anesthesia Type: General Level of consciousness: awake and alert Pain management: pain level controlled Vital Signs Assessment: post-procedure vital signs reviewed and stable Respiratory status: spontaneous breathing, nonlabored ventilation, respiratory function stable and patient connected to nasal cannula oxygen Cardiovascular status: blood pressure returned to baseline and stable Postop Assessment: no apparent nausea or vomiting Anesthetic complications: no   No notable events documented.  Last Vitals:  Vitals:   01/12/23 0400 01/12/23 0831  BP: 122/68 113/61  Pulse: (!) 59 71  Resp: 20 18  Temp: 36.9 C 37 C  SpO2: 96% 100%    Last Pain:  Vitals:   01/12/23 1127  TempSrc:   PainSc: 5                  Kennieth Rad
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
## 2023-01-12 NOTE — Discharge Instructions (Signed)
[facility] Discharge Instructions   General Discharge Instructions  WEIGHT BEARING STATUS: Nonweightbearing right lower extremity  RANGE OF MOTION/ACTIVITY: Okay for knee range of motion as tolerated  Wound Care: You may remove your surgical dressing on postop day #2, (Saturday, 01/13/2023). Once the incision is completely dry and without drainage, it may be left open to air out.  Showering may begin postop day #3, (Sunday, 01/14/2023).  Clean incision gently with soap and water.  After cleaning incision, pat dry with a towel and leave incision open.  DVT/PE prophylaxis: Aspirin 81 mg daily x 30 days  Diet: as you were eating previously.  Can use over the counter stool softeners and bowel preparations, such as Miralax, to help with bowel movements.  Narcotics can be constipating.  Be sure to drink plenty of fluids  PAIN MEDICATION USE AND EXPECTATIONS  You have likely been given narcotic medications to help control your pain.  After a traumatic event that results in an fracture (broken bone) with or without surgery, it is ok to use narcotic pain medications to help control one's pain.  We understand that everyone responds to pain differently and each individual patient will be evaluated on a regular basis for the continued need for narcotic medications. Ideally, narcotic medication use should last no more than 6-8 weeks (coinciding with fracture healing).   As a patient it is your responsibility as well to monitor narcotic medication use and report the amount and frequency you use these medications when you come to your office visit.   We would also advise that if you are using narcotic medications, you should take a dose prior to therapy to maximize you participation.  IF YOU ARE ON NARCOTIC MEDICATIONS IT IS NOT PERMISSIBLE TO OPERATE A MOTOR VEHICLE (MOTORCYCLE/CAR/TRUCK/MOPED) OR HEAVY MACHINERY DO NOT MIX NARCOTICS WITH OTHER CNS (CENTRAL NERVOUS SYSTEM) DEPRESSANTS SUCH AS  ALCOHOL   STOP SMOKING OR USING NICOTINE PRODUCTS!!!!  As discussed nicotine severely impairs your body's ability to heal surgical and traumatic wounds but also impairs bone healing.  Wounds and bone heal by forming microscopic blood vessels (angiogenesis) and nicotine is a vasoconstrictor (essentially, shrinks blood vessels).  Therefore, if vasoconstriction occurs to these microscopic blood vessels they essentially disappear and are unable to deliver necessary nutrients to the healing tissue.  This is one modifiable factor that you can do to dramatically increase your chances of healing your injury.    (This means no smoking, no nicotine gum, patches, etc)  DO NOT USE NONSTEROIDAL ANTI-INFLAMMATORY DRUGS (NSAID'S)  Using products such as Advil (ibuprofen), Aleve (naproxen), Motrin (ibuprofen) for additional pain control during fracture healing can delay and/or prevent the healing response.  If you would like to take over the counter (OTC) medication, Tylenol (acetaminophen) is ok.  However, some narcotic medications that are given for pain control contain acetaminophen as well. Therefore, you should not exceed more than 4000 mg of tylenol in a day if you do not have liver disease.  Also note that there are may OTC medicines, such as cold medicines and allergy medicines that my contain tylenol as well.  If you have any questions about medications and/or interactions please ask your doctor/PA or your pharmacist.      ICE AND ELEVATE INJURED/OPERATIVE EXTREMITY  Using ice and elevating the injured extremity above your heart can help with swelling and pain control.  Icing in a pulsatile fashion, such as 20 minutes on and 20 minutes off, can be followed.  Do not place ice directly on skin. Make sure there is a barrier between to skin and the ice pack.    Using frozen items such as frozen peas works well as the conform nicely to the are that needs to be iced.  USE AN ACE WRAP OR TED HOSE FOR SWELLING  CONTROL  In addition to icing and elevation, Ace wraps or TED hose are used to help limit and resolve swelling.  It is recommended to use Ace wraps or TED hose until you are informed to stop.    When using Ace Wraps start the wrapping distally (farthest away from the body) and wrap proximally (closer to the body)   Example: If you had surgery on your leg or thing and you do not have a splint on, start the ace wrap at the toes and work your way up to the thigh        If you had surgery on your upper extremity and do not have a splint on, start the ace wrap at your fingers and work your way up to the upper arm   CALL THE OFFICE FOR MEDICATION REFILLS OR WITH ANY QUESTIONS/CONCERNS: [phone]   VISIT OUR WEBSITE FOR ADDITIONAL INFORMATION: [email or web address]   Discharge Wound Care Instructions  Do NOT apply any ointments, solutions or lotions to pin sites or surgical wounds.  These prevent needed drainage and even though solutions like hydrogen peroxide kill bacteria, they also damage cells lining the pin sites that help fight infection.  Applying lotions or ointments can keep the wounds moist and can cause them to breakdown and open up as well. This can increase the risk for infection. When in doubt call the office.   If any drainage is noted, use one layer of adaptic or Mepitel, then gauze, Kerlix, and an ace wrap. - These dressing supplies should be available at local medical supply stores [facility], [facility], etc) as well as Insurance claims handler ([facility], [facility], [place], etc)  Once the incision is completely dry and without drainage, it may be left open to air out.  Showering may begin 36-48 hours later.  Cleaning gently with soap and water.  Traumatic wounds should be dressed daily as well.    One layer of adaptic, gauze, Kerlix, then ace wrap.  The adaptic can be discontinued once the draining has ceased    If you have a wet to dry dressing: wet the gauze with saline the squeeze  as much saline out so the gauze is moist (not soaking wet), place moistened gauze over wound, then place a dry gauze over the moist one, followed by Kerlix wrap, then ace wrap.

## 2023-01-12 NOTE — Progress Notes (Signed)
Nsg Discharge Note  Admit Date:  01/11/2023 Discharge date: 01/12/2023   Louis Miller to be D/C'd Home per MD order.  AVS completed.  Patient/caregiver able to verbalize understanding.  Discharge Medication: Allergies as of 01/12/2023   No Known Allergies      Medication List     STOP taking these medications    ibuprofen 600 MG tablet Commonly known as: ADVIL       TAKE these medications    acetaminophen 500 MG tablet Commonly known as: TYLENOL Take 2 tablets (1,000 mg total) by mouth every 6 (six) hours as needed for moderate pain, mild pain, headache or fever.   aspirin EC 81 MG tablet Take 1 tablet (81 mg total) by mouth daily. Swallow whole.   celecoxib 200 MG capsule Commonly known as: CeleBREX Take 1 capsule (200 mg total) by mouth 2 (two) times daily for 7 days.   methocarbamol 500 MG tablet Commonly known as: ROBAXIN Take 1 tablet (500 mg total) by mouth every 6 (six) hours as needed for muscle spasms.   Oxycodone HCl 10 MG Tabs Take 1 tablet (10 mg total) by mouth every 4 (four) hours as needed.               Durable Medical Equipment  (From admission, onward)           Start     Ordered   01/12/23 1031  For home use only DME Crutches  Once        01/12/23 1030            Discharge Assessment: Vitals:   01/12/23 0400 01/12/23 0831  BP: 122/68 113/61  Pulse: (!) 59 71  Resp: 20 18  Temp: 98.4 F (36.9 C) 98.6 F (37 C)  SpO2: 96% 100%   Skin clean, dry and intact without evidence of skin break down, no evidence of skin tears noted. IV catheter discontinued intact. Site without signs and symptoms of complications - no redness or edema noted at insertion site, patient denies c/o pain - only slight tenderness at site.  Dressing with slight pressure applied.  D/c Instructions-Education: Discharge instructions given to patient/family with verbalized understanding. D/c education completed with patient/family including follow up  instructions, medication list, d/c activities limitations if indicated, with other d/c instructions as indicated by MD - patient able to verbalize understanding, all questions fully answered. Patient instructed to return to ED, call 911, or call MD for any changes in condition.  Patient escorted via WC, and D/C home via private auto.  Kizzie Bane, RN 01/12/2023 12:19 PM

## 2023-01-12 NOTE — Evaluation (Signed)
Occupational Therapy Evaluation Patient Details Name: Louis Miller MRN: [id number] DOB: Dec 27, 1995 Today's Date: 01/12/2023   History of Present Illness 27 y.o. male admitted 5/30 with open Rt tibial plateau fracture. On 5/30 S/p Open reduction internal fixation of right bicondylar tibial plateau fracture and Irrigation and debridement of right open tibial plateau fracture. PMH: occipital neuralgia, kidney disease,  Allergic rhinitis.   Clinical Impression   Pt admitted for above dx, PTA patient reports being fully independent in ADLs and ambulation. Pt currently presenting with RLE pain and impaired balance due to NWB RLE. Pt ambulating with Supervision with use of crutches, demonstrates good techniques to maintain NWB status with functional transfers. Pt able to complete lower body dressing with supervision/setup, discussed with patient the benefits and use of a shower seat to help perform bathing independently. OT to continue to follow pt acutely to address above deficits and help transition to next level of care. No follow-up OT recommended at this time.       Recommendations for follow up therapy are one component of a multi-disciplinary discharge planning process, led by the attending physician.  Recommendations may be updated based on patient status, additional functional criteria and insurance authorization.   Assistance Recommended at Discharge Set up Supervision/Assistance  Patient can return home with the following Assistance with cooking/housework;Assist for transportation    Functional Status Assessment  Patient has had a recent decline in their functional status and demonstrates the ability to make significant improvements in function in a reasonable and predictable amount of time.  Equipment Recommendations  Tub/shower seat (shower seat/stool)    Recommendations for Other Services       Precautions / Restrictions Precautions Precautions: Fall Restrictions Weight  Bearing Restrictions: Yes RLE Weight Bearing: Non weight bearing      Mobility Bed Mobility               General bed mobility comments: Pt rec'd and left sitting in recliner    Transfers Overall transfer level: Needs assistance Equipment used: Crutches Transfers: Sit to/from Stand Sit to Stand: Supervision           General transfer comment: Good recall of NWB status      Balance       Sitting balance - Comments: Pt in recliner, unable to fully assess   Standing balance support: Single extremity supported, During functional activity Standing balance-Leahy Scale: Poor Standing balance comment: + Crutches                           ADL either performed or assessed with clinical judgement   ADL Overall ADL's :  (Supervision)                                       General ADL Comments: Pt demonstrated ability to complete room level ambulation with Supervision + Cructhes with good hand placement. Pt donned bilat socks with Supervision while sitting in recliner, mild challenge noted. Toilet t/f with supervision and use of crutches to assist with STS     Vision         Perception     Praxis      Pertinent Vitals/Pain Pain Assessment Pain Assessment: Faces Faces Pain Scale: Hurts little more Pain Location: RLE Pain Descriptors / Indicators: Aching, Discomfort, Sore, Guarding Pain Intervention(s): Monitored during session, Repositioned     Hand  Dominance  (ambi)   Extremity/Trunk Assessment Upper Extremity Assessment Upper Extremity Assessment: Overall WFL for tasks assessed (RUE bruises)   Lower Extremity Assessment Lower Extremity Assessment: RLE deficits/detail RLE Deficits / Details: Bandaged. Able to move ankle and toes volitionally.   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, has good understanding of rec DME usage    Exercises     Shoulder Instructions      Home Living Family/patient expects to be discharged to:: Private residence Living Arrangements: Spouse/significant other;Children Available Help at Discharge: Family;Available 24 hours/day Type of Home: Mobile home Home Access: Stairs to enter Entrance Stairs-Number of Steps: 5 Entrance Stairs-Rails: Right;Left;Can reach both Home Layout: One level     Bathroom Shower/Tub: Tub/shower unit;Walk-in shower   Bathroom Toilet: Standard     Home Equipment: None          Prior Functioning/Environment Prior Level of Function : Independent/Modified Independent;Working/employed;Driving             Mobility Comments: ind ADLs Comments: ind        OT Problem List: Impaired balance (sitting and/or standing);Pain      OT Treatment/Interventions: Balance training;Self-care/ADL training;Therapeutic activities;Patient/family education    OT Goals(Current goals can be found in the care plan section) Acute Rehab OT Goals Patient Stated Goal: To go home OT Goal Formulation: With patient/family Time For Goal Achievement: 01/26/23 Potential to Achieve Goals: Good ADL Goals Pt Will Perform Lower Body Bathing: sitting/lateral leans;with set-up;with supervision;with adaptive equipment Pt Will Perform Lower Body Dressing: with modified independence;sitting/lateral leans  OT Frequency: Min 2X/week    Co-evaluation              AM-PAC OT "6 Clicks" Daily Activity     Outcome Measure Help from another person eating meals?: None Help from another person taking care of personal grooming?: A Little Help from another person toileting, which includes using toliet, bedpan, or urinal?: A  Little Help from another person bathing (including washing, rinsing, drying)?: A Little Help from another person to put on and taking off regular upper body clothing?: None Help from another person to put on and taking off regular lower body clothing?: A Little 6 Click Score: 20   End of Session Equipment Utilized During Treatment: Gait belt;Rolling walker (2 wheels) Nurse Communication: Mobility status  Activity Tolerance: Patient tolerated treatment well Patient left: in chair;with family/visitor present;with call bell/phone within reach;with chair alarm set  OT Visit Diagnosis: Unsteadiness on feet (R26.81);Pain Pain - Right/Left: Right Pain - part of body: Leg                Time: 2956-2130 OT Time Calculation (min): 14 min Charges:  OT General Charges $OT Visit: 1 Visit OT Evaluation $OT Eval Low Complexity: 1 Low  01/12/2023  AB, OTR/L  Acute Rehabilitation Services  Office: [phone]   JIMMEY KELLUM 01/12/2023, 11:09 AM

## 2023-01-12 NOTE — Plan of Care (Signed)

## 2023-01-13 LAB — MISC LABCORP TEST (SEND OUT): Labcorp test code: 81950

## 2023-01-15 ENCOUNTER — Encounter (HOSPITAL_COMMUNITY): Payer: Self-pay | Admitting: Student

## 2023-01-15 ENCOUNTER — Other Ambulatory Visit (HOSPITAL_COMMUNITY): Payer: Self-pay

## 2023-11-18 IMAGING — DX DG KNEE COMPLETE 4+V*L*
4 series · 4 of 4 positions shown · non-contrast
Comparison: None.

CLINICAL DATA: Slipped and injured left knee yesterday.

EXAM:
LEFT KNEE - COMPLETE 4+ VIEW

[knee ap]
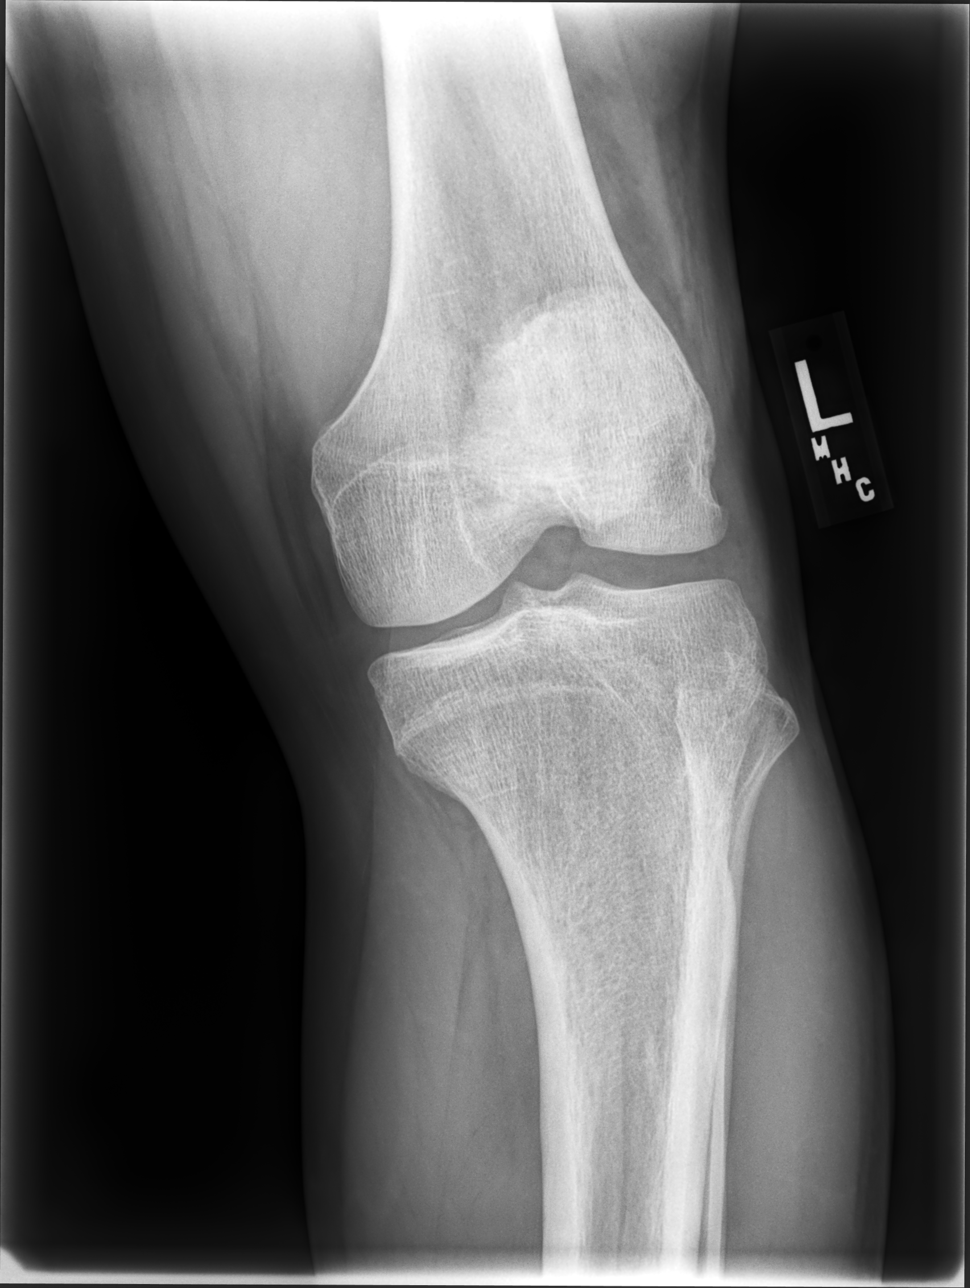

[knee mlo]
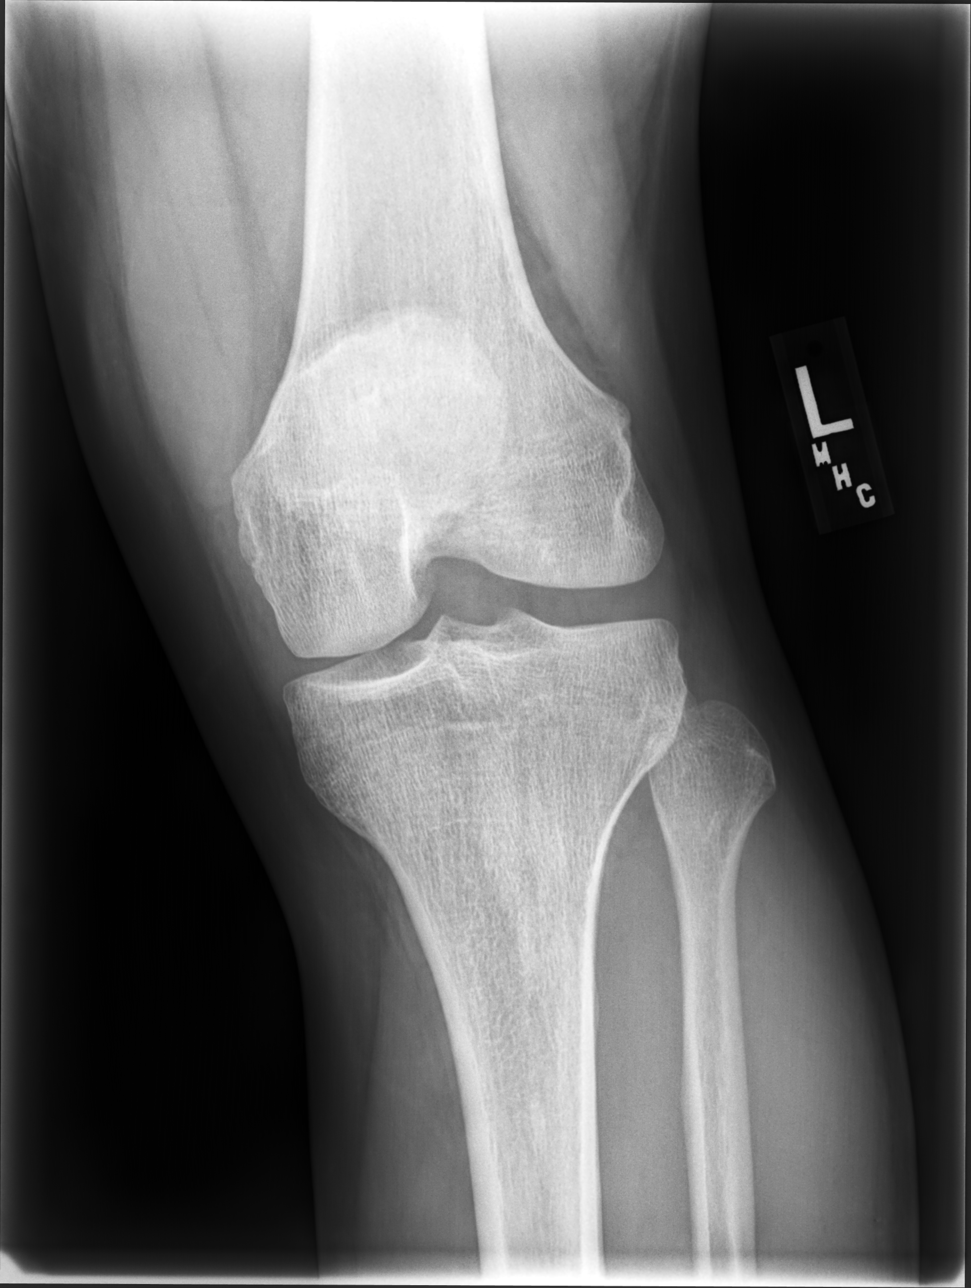

[knee lmo]
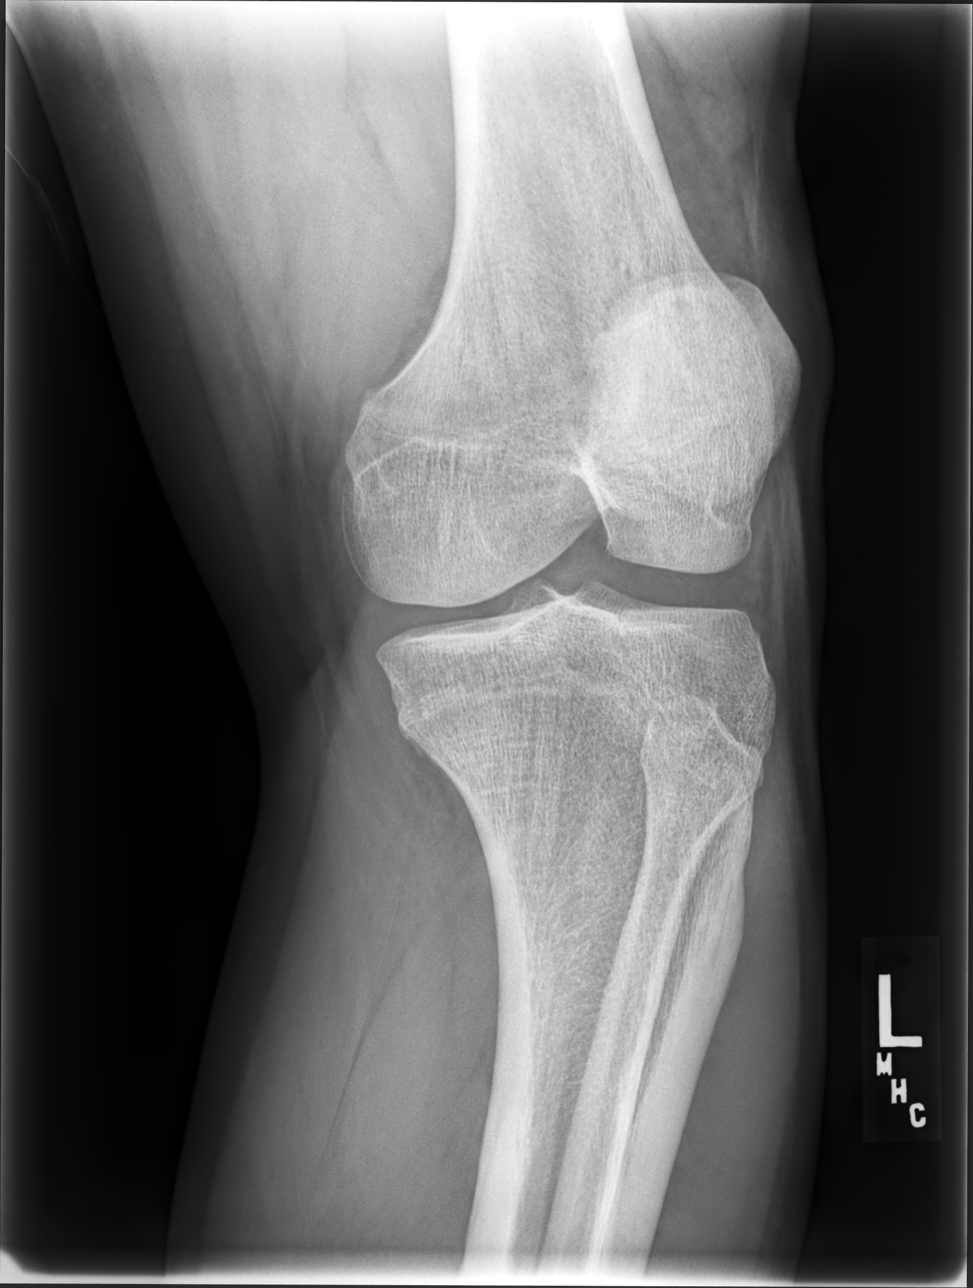

[knee lat]
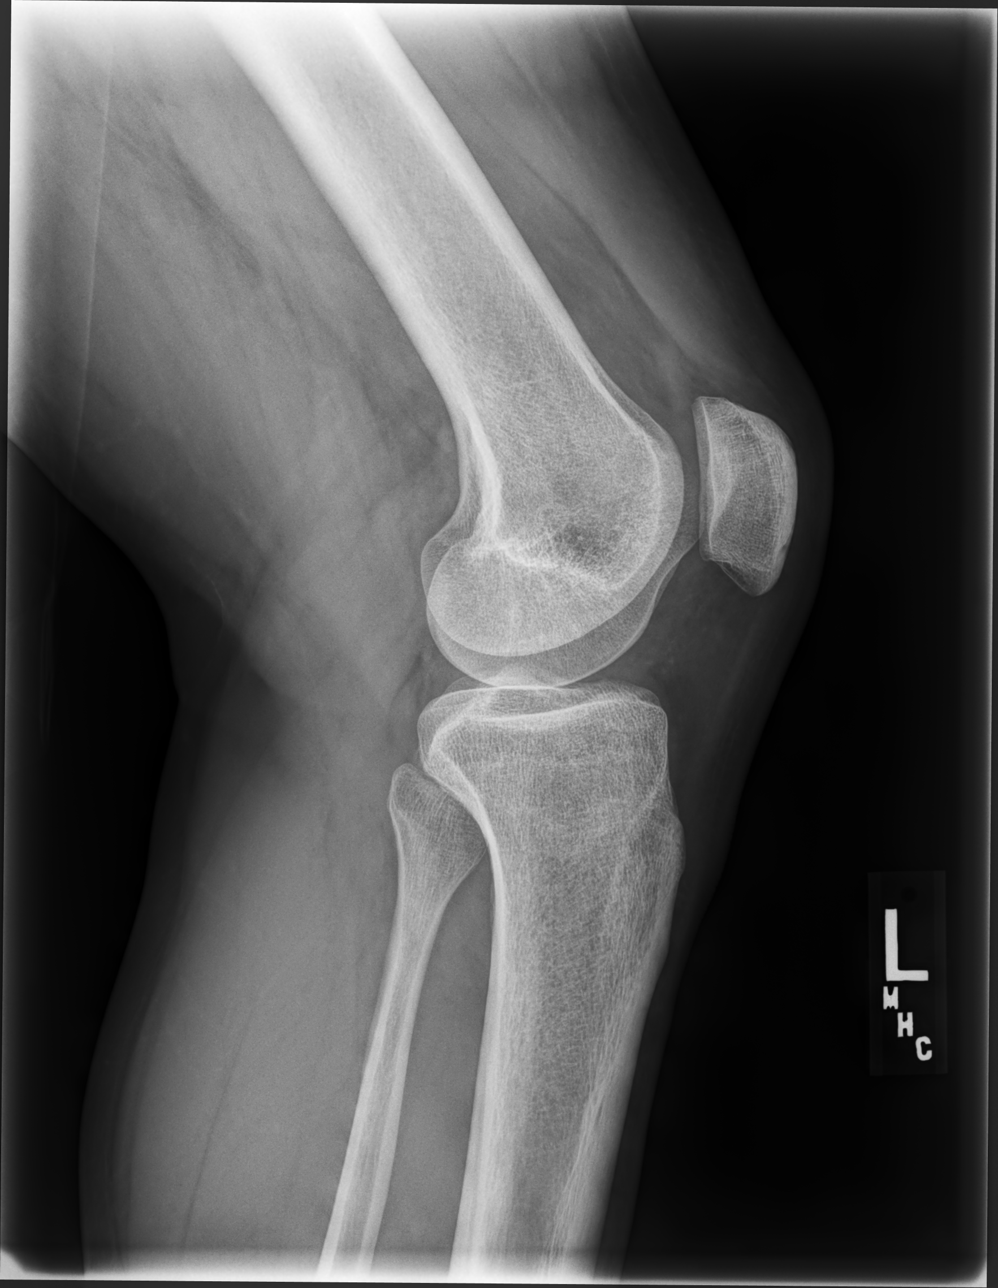

[4 of 4 positions shown; findings below may reference images not displayed]

FINDINGS: No evidence of fracture, dislocation, or joint effusion. No evidence
of arthropathy or other focal bone abnormality. Soft tissues are
unremarkable.
IMPRESSION: Negative.
